# Patient Record
Sex: Female | Born: 1985 | Race: Black or African American | Hispanic: No | Smoking: Never smoker
Health system: Southern US, Community
[De-identification: ages and names within clinical notes are randomized; demographics above are authoritative.]

## PROBLEM LIST (undated history)

## (undated) DIAGNOSIS — D259 Leiomyoma of uterus, unspecified: Secondary | ICD-10-CM

## (undated) DIAGNOSIS — Z789 Other specified health status: Secondary | ICD-10-CM

## (undated) DIAGNOSIS — D649 Anemia, unspecified: Secondary | ICD-10-CM

---

## 2008-09-05 ENCOUNTER — Emergency Department (HOSPITAL_COMMUNITY): Admission: EM | Admit: 2008-09-05 | Discharge: 2008-09-05 | Payer: Self-pay | Admitting: Family Medicine

## 2011-09-24 LAB — OB RESULTS CONSOLE ABO/RH

## 2011-09-24 LAB — OB RESULTS CONSOLE ANTIBODY SCREEN: Antibody Screen: NEGATIVE

## 2011-09-24 LAB — OB RESULTS CONSOLE RPR: RPR: NONREACTIVE

## 2011-09-24 LAB — OB RESULTS CONSOLE GC/CHLAMYDIA
Chlamydia: NEGATIVE
Gonorrhea: NEGATIVE

## 2011-09-24 LAB — OB RESULTS CONSOLE HIV ANTIBODY (ROUTINE TESTING): HIV: NONREACTIVE

## 2012-04-07 ENCOUNTER — Inpatient Hospital Stay (HOSPITAL_COMMUNITY): Payer: 59

## 2012-04-07 ENCOUNTER — Encounter (HOSPITAL_COMMUNITY): Payer: Self-pay

## 2012-04-07 ENCOUNTER — Encounter (HOSPITAL_COMMUNITY): Payer: Self-pay | Admitting: *Deleted

## 2012-04-07 ENCOUNTER — Inpatient Hospital Stay (HOSPITAL_COMMUNITY)
Admission: AD | Admit: 2012-04-07 | Discharge: 2012-04-07 | Disposition: A | Discharge status: Home / Self Care | Payer: 59 | Source: Ambulatory Visit | Attending: Obstetrics and Gynecology | Admitting: Obstetrics and Gynecology

## 2012-04-07 ENCOUNTER — Inpatient Hospital Stay (HOSPITAL_COMMUNITY)
Admission: AD | Admit: 2012-04-07 | Discharge: 2012-04-10 | DRG: 766 | Disposition: A | Discharge status: Home / Self Care | Payer: 59 | Source: Ambulatory Visit | Attending: Obstetrics and Gynecology | Admitting: Obstetrics and Gynecology

## 2012-04-07 DIAGNOSIS — O479 False labor, unspecified: Secondary | ICD-10-CM | POA: Insufficient documentation

## 2012-04-07 DIAGNOSIS — O34219 Maternal care for unspecified type scar from previous cesarean delivery: Principal | ICD-10-CM | POA: Diagnosis not present

## 2012-04-07 HISTORY — DX: Anemia, unspecified: D64.9

## 2012-04-07 HISTORY — DX: Other specified health status: Z78.9

## 2012-04-07 MED ORDER — NIFEDIPINE 10 MG PO CAPS
10.0000 mg | ORAL_CAPSULE | Freq: Once | ORAL | Status: AC
  Administered 2012-04-07: 10 mg via ORAL
  Filled 2012-04-07: qty 1

## 2012-04-07 MED ORDER — ZOLPIDEM TARTRATE 5 MG PO TABS
10.0000 mg | ORAL_TABLET | Freq: Once | ORAL | Status: AC
  Administered 2012-04-07: 10 mg via ORAL
  Filled 2012-04-07: qty 1

## 2012-04-07 NOTE — Progress Notes (Signed)
Dr. Henderson Cloud notified of unchanged cervical exam done by [facility]. Contraction pattern is the same, pt is requesting something for pain. Orders received, ok to send patient home.

## 2012-04-07 NOTE — MAU Note (Signed)
Pt states started having ctx's at 0700 this am. Now q8-10 minutes apart. Denies bleeding or lof.

## 2012-04-08 ENCOUNTER — Encounter (HOSPITAL_COMMUNITY)
Admission: AD | Disposition: A | Discharge status: Home / Self Care | Payer: Self-pay | Source: Ambulatory Visit | Attending: Obstetrics and Gynecology

## 2012-04-08 ENCOUNTER — Encounter (HOSPITAL_COMMUNITY): Payer: Self-pay | Admitting: Family Medicine

## 2012-04-08 ENCOUNTER — Encounter (HOSPITAL_COMMUNITY): Payer: Self-pay | Admitting: Anesthesiology

## 2012-04-08 ENCOUNTER — Inpatient Hospital Stay (HOSPITAL_COMMUNITY): Payer: 59 | Admitting: Anesthesiology

## 2012-04-08 LAB — CBC
HCT: 34.8 % — ABNORMAL LOW (ref 36.0–46.0)
MCH: 21.1 pg — ABNORMAL LOW (ref 26.0–34.0)
MCV: 67.3 fL — ABNORMAL LOW (ref 78.0–100.0)
RDW: 16.1 % — ABNORMAL HIGH (ref 11.5–15.5)
WBC: 7 10*3/uL (ref 4.0–10.5)

## 2012-04-08 LAB — TYPE AND SCREEN: Antibody Screen: NEGATIVE

## 2012-04-08 SURGERY — Surgical Case
Anesthesia: Spinal | Site: Abdomen | Wound class: Clean Contaminated

## 2012-04-08 MED ORDER — OXYTOCIN 40 UNITS IN LACTATED RINGERS INFUSION - SIMPLE MED: 62.5000 mL/h | INTRAVENOUS | Status: AC

## 2012-04-08 MED ORDER — METOCLOPRAMIDE HCL 5 MG/ML IJ SOLN
10.0000 mg | Freq: Three times a day (TID) | INTRAMUSCULAR | Status: DC | PRN
  Administered 2012-04-08: 10 mg via INTRAVENOUS
  Filled 2012-04-08: qty 2

## 2012-04-08 MED ORDER — MEPERIDINE HCL 25 MG/ML IJ SOLN: 6.2500 mg | INTRAMUSCULAR | Status: DC | PRN

## 2012-04-08 MED ORDER — KETOROLAC TROMETHAMINE 30 MG/ML IJ SOLN: 30.0000 mg | Freq: Four times a day (QID) | INTRAMUSCULAR | Status: DC | PRN

## 2012-04-08 MED ORDER — KETOROLAC TROMETHAMINE 30 MG/ML IJ SOLN
30.0000 mg | Freq: Four times a day (QID) | INTRAMUSCULAR | Status: DC | PRN
  Administered 2012-04-08: 30 mg via INTRAVENOUS

## 2012-04-08 MED ORDER — EPHEDRINE 5 MG/ML INJ
INTRAVENOUS | Status: AC
  Filled 2012-04-08: qty 10

## 2012-04-08 MED ORDER — ZOLPIDEM TARTRATE 5 MG PO TABS: 5.0000 mg | ORAL_TABLET | Freq: Every evening | ORAL | Status: DC | PRN

## 2012-04-08 MED ORDER — CEFAZOLIN SODIUM-DEXTROSE 2-3 GM-% IV SOLR
INTRAVENOUS | Status: DC | PRN
  Administered 2012-04-08: 2 g via INTRAVENOUS

## 2012-04-08 MED ORDER — ONDANSETRON HCL 4 MG/2ML IJ SOLN: 4.0000 mg | Freq: Three times a day (TID) | INTRAMUSCULAR | Status: DC | PRN

## 2012-04-08 MED ORDER — FLEET ENEMA 7-19 GM/118ML RE ENEM: 1.0000 | ENEMA | Freq: Every day | RECTAL | Status: DC | PRN

## 2012-04-08 MED ORDER — FENTANYL CITRATE 0.05 MG/ML IJ SOLN
INTRAMUSCULAR | Status: AC
  Filled 2012-04-08: qty 2

## 2012-04-08 MED ORDER — LACTATED RINGERS IV SOLN
INTRAVENOUS | Status: DC
  Administered 2012-04-08 – 2012-04-09 (×3): via INTRAVENOUS

## 2012-04-08 MED ORDER — OXYTOCIN 10 UNIT/ML IJ SOLN
40.0000 [IU] | INTRAVENOUS | Status: DC | PRN
  Administered 2012-04-08: 40 [IU] via INTRAVENOUS

## 2012-04-08 MED ORDER — TETANUS-DIPHTH-ACELL PERTUSSIS 5-2.5-18.5 LF-MCG/0.5 IM SUSP: 0.5000 mL | Freq: Once | INTRAMUSCULAR | Status: DC

## 2012-04-08 MED ORDER — METHYLERGONOVINE MALEATE 0.2 MG PO TABS: 0.2000 mg | ORAL_TABLET | ORAL | Status: DC | PRN

## 2012-04-08 MED ORDER — CITRIC ACID-SODIUM CITRATE 334-500 MG/5ML PO SOLN
ORAL | Status: AC
  Administered 2012-04-08: 30 mL
  Filled 2012-04-08: qty 15

## 2012-04-08 MED ORDER — SIMETHICONE 80 MG PO CHEW
80.0000 mg | CHEWABLE_TABLET | Freq: Three times a day (TID) | ORAL | Status: DC
  Administered 2012-04-08 – 2012-04-10 (×7): 80 mg via ORAL

## 2012-04-08 MED ORDER — FENTANYL CITRATE 0.05 MG/ML IJ SOLN: 25.0000 ug | INTRAMUSCULAR | Status: DC | PRN

## 2012-04-08 MED ORDER — BUTORPHANOL TARTRATE 2 MG/ML IJ SOLN
2.0000 mg | INTRAMUSCULAR | Status: DC | PRN
  Administered 2012-04-08: 2 mg via INTRAVENOUS
  Administered 2012-04-08 (×2): 1 mg via INTRAVENOUS
  Administered 2012-04-08: 2 mg via INTRAVENOUS
  Filled 2012-04-08 (×2): qty 1
  Filled 2012-04-08 (×2): qty 2

## 2012-04-08 MED ORDER — DIPHENHYDRAMINE HCL 25 MG PO CAPS: 25.0000 mg | ORAL_CAPSULE | ORAL | Status: DC | PRN

## 2012-04-08 MED ORDER — DIPHENHYDRAMINE HCL 50 MG/ML IJ SOLN: 25.0000 mg | INTRAMUSCULAR | Status: DC | PRN

## 2012-04-08 MED ORDER — ONDANSETRON HCL 4 MG/2ML IJ SOLN
INTRAMUSCULAR | Status: DC | PRN
  Administered 2012-04-08: 4 mg via INTRAVENOUS

## 2012-04-08 MED ORDER — SCOPOLAMINE 1 MG/3DAYS TD PT72: 1.0000 | MEDICATED_PATCH | Freq: Once | TRANSDERMAL | Status: DC

## 2012-04-08 MED ORDER — ONDANSETRON HCL 4 MG PO TABS: 4.0000 mg | ORAL_TABLET | ORAL | Status: DC | PRN

## 2012-04-08 MED ORDER — OXYTOCIN 10 UNIT/ML IJ SOLN
INTRAMUSCULAR | Status: AC
  Filled 2012-04-08: qty 4

## 2012-04-08 MED ORDER — NALBUPHINE HCL 10 MG/ML IJ SOLN
5.0000 mg | INTRAMUSCULAR | Status: DC | PRN
  Filled 2012-04-08: qty 1

## 2012-04-08 MED ORDER — NALBUPHINE HCL 10 MG/ML IJ SOLN
5.0000 mg | INTRAMUSCULAR | Status: DC | PRN
Start: 2012-04-08 — End: 2012-04-08
  Filled 2012-04-08: qty 1

## 2012-04-08 MED ORDER — EPHEDRINE SULFATE 50 MG/ML IJ SOLN
INTRAMUSCULAR | Status: DC | PRN
  Administered 2012-04-08 (×3): 10 mg via INTRAVENOUS

## 2012-04-08 MED ORDER — SENNOSIDES-DOCUSATE SODIUM 8.6-50 MG PO TABS
2.0000 | ORAL_TABLET | Freq: Every day | ORAL | Status: DC
  Administered 2012-04-08 – 2012-04-09 (×2): 2 via ORAL

## 2012-04-08 MED ORDER — DIPHENHYDRAMINE HCL 50 MG/ML IJ SOLN: 12.5000 mg | INTRAMUSCULAR | Status: DC | PRN

## 2012-04-08 MED ORDER — NALOXONE HCL 0.4 MG/ML IJ SOLN
1.0000 ug/kg/h | INTRAMUSCULAR | Status: DC | PRN
  Filled 2012-04-08: qty 2.5

## 2012-04-08 MED ORDER — WITCH HAZEL-GLYCERIN EX PADS: 1.0000 "application " | MEDICATED_PAD | CUTANEOUS | Status: DC | PRN

## 2012-04-08 MED ORDER — MORPHINE SULFATE (PF) 0.5 MG/ML IJ SOLN
INTRAMUSCULAR | Status: DC | PRN
  Administered 2012-04-08: .1 mg via INTRATHECAL

## 2012-04-08 MED ORDER — ONDANSETRON HCL 4 MG/2ML IJ SOLN
4.0000 mg | INTRAMUSCULAR | Status: DC | PRN
  Administered 2012-04-08: 4 mg via INTRAVENOUS
  Filled 2012-04-08: qty 2

## 2012-04-08 MED ORDER — IBUPROFEN 600 MG PO TABS
600.0000 mg | ORAL_TABLET | Freq: Four times a day (QID) | ORAL | Status: DC
  Administered 2012-04-09 – 2012-04-10 (×7): 600 mg via ORAL
  Filled 2012-04-08 (×7): qty 1

## 2012-04-08 MED ORDER — MIDAZOLAM HCL 2 MG/2ML IJ SOLN: 0.5000 mg | Freq: Once | INTRAMUSCULAR | Status: DC | PRN

## 2012-04-08 MED ORDER — LACTATED RINGERS IV SOLN
INTRAVENOUS | Status: DC
  Administered 2012-04-08: via INTRAVENOUS
  Administered 2012-04-08: 125 mL/h via INTRAVENOUS

## 2012-04-08 MED ORDER — OXYCODONE-ACETAMINOPHEN 5-325 MG PO TABS
1.0000 | ORAL_TABLET | ORAL | Status: DC | PRN
  Administered 2012-04-09 (×3): 1 via ORAL
  Administered 2012-04-10: 2 via ORAL
  Administered 2012-04-10 (×4): 1 via ORAL
  Filled 2012-04-08 (×6): qty 1
  Filled 2012-04-08: qty 2
  Filled 2012-04-08: qty 1

## 2012-04-08 MED ORDER — SODIUM CHLORIDE 0.9 % IJ SOLN: 3.0000 mL | INTRAMUSCULAR | Status: DC | PRN

## 2012-04-08 MED ORDER — LANOLIN HYDROUS EX OINT: 1.0000 "application " | TOPICAL_OINTMENT | CUTANEOUS | Status: DC | PRN

## 2012-04-08 MED ORDER — MEASLES, MUMPS & RUBELLA VAC ~~LOC~~ INJ: 0.5000 mL | INJECTION | Freq: Once | SUBCUTANEOUS | Status: DC

## 2012-04-08 MED ORDER — LACTATED RINGERS IV SOLN
INTRAVENOUS | Status: DC | PRN
  Administered 2012-04-08 (×2): via INTRAVENOUS

## 2012-04-08 MED ORDER — SIMETHICONE 80 MG PO CHEW: 80.0000 mg | CHEWABLE_TABLET | ORAL | Status: DC | PRN

## 2012-04-08 MED ORDER — DIBUCAINE 1 % RE OINT: 1.0000 "application " | TOPICAL_OINTMENT | RECTAL | Status: DC | PRN

## 2012-04-08 MED ORDER — PROMETHAZINE HCL 25 MG/ML IJ SOLN: 6.2500 mg | INTRAMUSCULAR | Status: DC | PRN

## 2012-04-08 MED ORDER — KETOROLAC TROMETHAMINE 30 MG/ML IJ SOLN
INTRAMUSCULAR | Status: AC
  Filled 2012-04-08: qty 1

## 2012-04-08 MED ORDER — DIPHENHYDRAMINE HCL 25 MG PO CAPS
25.0000 mg | ORAL_CAPSULE | Freq: Four times a day (QID) | ORAL | Status: DC | PRN
  Administered 2012-04-08: 25 mg via ORAL
  Filled 2012-04-08: qty 1

## 2012-04-08 MED ORDER — BUPIVACAINE IN DEXTROSE 0.75-8.25 % IT SOLN
INTRATHECAL | Status: DC | PRN
  Administered 2012-04-08: 1.5 mL via INTRATHECAL

## 2012-04-08 MED ORDER — NALOXONE HCL 0.4 MG/ML IJ SOLN: 0.4000 mg | INTRAMUSCULAR | Status: DC | PRN

## 2012-04-08 MED ORDER — ACETAMINOPHEN 10 MG/ML IV SOLN
1000.0000 mg | Freq: Four times a day (QID) | INTRAVENOUS | Status: AC | PRN
  Filled 2012-04-08: qty 100

## 2012-04-08 MED ORDER — METHYLERGONOVINE MALEATE 0.2 MG/ML IJ SOLN: 0.2000 mg | INTRAMUSCULAR | Status: DC | PRN

## 2012-04-08 MED ORDER — MENTHOL 3 MG MT LOZG: 1.0000 | LOZENGE | OROMUCOSAL | Status: DC | PRN

## 2012-04-08 MED ORDER — FERROUS SULFATE 325 (65 FE) MG PO TABS
325.0000 mg | ORAL_TABLET | Freq: Two times a day (BID) | ORAL | Status: DC
  Administered 2012-04-09 – 2012-04-10 (×3): 325 mg via ORAL
  Filled 2012-04-08 (×3): qty 1

## 2012-04-08 MED ORDER — BISACODYL 10 MG RE SUPP: 10.0000 mg | Freq: Every day | RECTAL | Status: DC | PRN

## 2012-04-08 MED ORDER — 0.9 % SODIUM CHLORIDE (POUR BTL) OPTIME
TOPICAL | Status: DC | PRN
  Administered 2012-04-08: 1000 mL

## 2012-04-08 MED ORDER — MORPHINE SULFATE 0.5 MG/ML IJ SOLN
INTRAMUSCULAR | Status: AC
  Filled 2012-04-08: qty 10

## 2012-04-08 MED ORDER — PRENATAL MULTIVITAMIN CH
1.0000 | ORAL_TABLET | Freq: Every day | ORAL | Status: DC
  Administered 2012-04-09 – 2012-04-10 (×2): 1 via ORAL
  Filled 2012-04-08 (×3): qty 1

## 2012-04-08 MED ORDER — ONDANSETRON HCL 4 MG/2ML IJ SOLN
INTRAMUSCULAR | Status: AC
  Filled 2012-04-08: qty 2

## 2012-04-08 MED ORDER — FENTANYL CITRATE 0.05 MG/ML IJ SOLN
INTRAMUSCULAR | Status: DC | PRN
  Administered 2012-04-08: 25 ug via INTRATHECAL

## 2012-04-08 SURGICAL SUPPLY — 39 items
CHLORAPREP W/TINT 26ML (MISCELLANEOUS) ×2 IMPLANT
CLOTH BEACON ORANGE TIMEOUT ST (SAFETY) ×2 IMPLANT
DISSECTOR BLUNT CHERRY 10MM (MISCELLANEOUS) ×2 IMPLANT
DRSG COVADERM 4X10 (GAUZE/BANDAGES/DRESSINGS) ×2 IMPLANT
ELECT REM PT RETURN 9FT ADLT (ELECTROSURGICAL) ×2
ELECTRODE REM PT RTRN 9FT ADLT (ELECTROSURGICAL) ×1 IMPLANT
EXTRACTOR VACUUM BELL STYLE (SUCTIONS) IMPLANT
GLOVE BIO SURGEON STRL SZ7 (GLOVE) ×6 IMPLANT
GLOVE SKINSENSE NS SZ7.5 (GLOVE) ×1
GLOVE SKINSENSE NS SZ8.0 LF (GLOVE) ×1
GLOVE SKINSENSE STRL SZ7.5 (GLOVE) ×1 IMPLANT
GLOVE SKINSENSE STRL SZ8.0 LF (GLOVE) ×1 IMPLANT
GOWN PREVENTION PLUS LG XLONG (DISPOSABLE) ×6 IMPLANT
KIT ABG SYR 3ML LUER SLIP (SYRINGE) IMPLANT
NEEDLE HYPO 25X5/8 SAFETYGLIDE (NEEDLE) IMPLANT
NS IRRIG 1000ML POUR BTL (IV SOLUTION) ×2 IMPLANT
PACK C SECTION WH (CUSTOM PROCEDURE TRAY) ×2 IMPLANT
PAD ABD 7.5X8 STRL (GAUZE/BANDAGES/DRESSINGS) ×2 IMPLANT
PAD OB MATERNITY 4.3X12.25 (PERSONAL CARE ITEMS) ×2 IMPLANT
RETRACTOR WND ALEXIS 25 LRG (MISCELLANEOUS) ×1 IMPLANT
RTRCTR WOUND ALEXIS 25CM LRG (MISCELLANEOUS) ×2
SLEEVE SCD COMPRESS KNEE MED (MISCELLANEOUS) ×2 IMPLANT
SPONGE GAUZE 4X4 12PLY (GAUZE/BANDAGES/DRESSINGS) ×2 IMPLANT
STAPLER VISISTAT 35W (STAPLE) ×2 IMPLANT
SUT MNCRL 0 VIOLET CTX 36 (SUTURE) ×4 IMPLANT
SUT MONOCRYL 0 CTX 36 (SUTURE) ×4
SUT PDS AB 0 CTX 60 (SUTURE) IMPLANT
SUT PLAIN 2 0 (SUTURE) ×1
SUT PLAIN 2 0 XLH (SUTURE) IMPLANT
SUT PLAIN ABS 2-0 CT1 27XMFL (SUTURE) ×1 IMPLANT
SUT VIC AB 0 CT1 27 (SUTURE) ×2
SUT VIC AB 0 CT1 27XBRD ANBCTR (SUTURE) ×2 IMPLANT
SUT VIC AB 2-0 CT1 27 (SUTURE) ×1
SUT VIC AB 2-0 CT1 TAPERPNT 27 (SUTURE) ×1 IMPLANT
SYR 50ML LL SCALE MARK (SYRINGE) ×2 IMPLANT
TAPE CLOTH SURG 4X10 WHT LF (GAUZE/BANDAGES/DRESSINGS) ×2 IMPLANT
TOWEL OR 17X24 6PK STRL BLUE (TOWEL DISPOSABLE) ×4 IMPLANT
TRAY FOLEY CATH 14FR (SET/KITS/TRAYS/PACK) ×2 IMPLANT
WATER STERILE IRR 1000ML POUR (IV SOLUTION) IMPLANT

## 2012-04-08 NOTE — Anesthesia Postprocedure Evaluation (Signed)
Anesthesia Post Note  Patient: Hannah [name]  Procedure(s) Performed: Procedure(s) (LRB): CESAREAN SECTION (N/A)  Anesthesia type: Spinal  Patient location: Mother/Baby  Post pain: Pain level controlled  Post assessment: Post-op Vital signs reviewed  Last Vitals:  Filed Vitals:   04/08/12 1730  BP: 110/75  Pulse: 67  Temp: 36.3 C  Resp: 18    Post vital signs: Reviewed  Level of consciousness: awake  Complications: No apparent anesthesia complications

## 2012-04-08 NOTE — Transfer of Care (Signed)
Immediate Anesthesia Transfer of Care Note  Patient: Hannah [name]  Procedure(s) Performed: Procedure(s) (LRB) with comments: CESAREAN SECTION (N/A) - Repeat Cesarean Section Delivery Boy @ [phone],  Patient Location: PACU  Anesthesia Type: Spinal  Level of Consciousness: awake, alert  and oriented  Airway & Oxygen Therapy: Patient Spontanous Breathing  Post-op Assessment: Report given to PACU RN and Post -op Vital signs reviewed and stable  Post vital signs: Reviewed and stable  Complications: No apparent anesthesia complications

## 2012-04-08 NOTE — Op Note (Signed)
04/07/2012 - 04/08/2012  7:38 AM  PATIENT:  Hannah [name]  26 y.o. female  PRE-OPERATIVE DIAGNOSIS:  Previous Cesarean, Labor  POST-OPERATIVE DIAGNOSIS:  Previous Cesarean, Labor  PROCEDURE:  Procedure(s) (LRB) with comments: CESAREAN SECTION (N/A) - Repeat Cesarean Section Delivery Boy @ [phone],  SURGEON:  Surgeon(s) and Role:    * Loney Laurence, MD - Primary  ASSISTANTS: none   ANESTHESIA:   spinal  EBL:  Total I/O In: -  Out: 800 [Urine:200; Blood:600]   SPECIMEN:  No Specimen  DISPOSITION OF SPECIMEN:  N/A  COUNTS:  YES  DICTATION: .Note written in EPIC  PLAN OF CARE: Admit to inpatient   PATIENT DISPOSITION:  PACU - hemodynamically stable.   Delay start of Pharmacological VTE agent (>24hrs) due to surgical blood loss or risk of bleeding: not applicable  Complications:  none Medications:  Ancef, Pitocin Findings:  Baby female, Apgars 9, weight P.   Normal tubes, ovaries and uterus seen.  Technique:  After adequate spinal anesthesia was achieved, the patient was prepped and draped in usual sterile fashion.  A foley catheter was used to drain the bladder.  A pfannanstiel incision was made with the scalpel and carried down to the fascia with the bovie cautery. The fascia was incised in the midline with the scalpel and carried in a transverse curvilinear manner bilaterally.  The fascia was reflected superiorly and inferiorly off the rectus muscles and the muscles split in the midline superiorly.  The peritoneum and rectus were thickly adhesed to the serosa of the uterus.   A bowel free portion of the peritoneum was entered sharply above the adhesion and careful dissection of the peritoneum, bladder fat and uterus was undertaken with the scalpel by starting at the edges of the adhesion laterally and moving medially.  The peritoneum was able to be removed from the uterus and then the incision extended in a superior and inferior manner with good visualization of the bowel  and bladder.  The Alexis instrument was then placed and the vesico-uterine fascia tented up and incised in a transverse curvilinear manner.  A 2 cm transverse incision was made in the upper portion of the lower uterine segment until the amnion was exposed.   The incision was extended transversely in a blunt manner.  Clear fluid was noted and the baby delivered in the vertex presentation without complication.  The baby was bulb suctioned and the cord was clamped and cut.  The baby was then handed to awaiting Neonatology.  The placenta was then delivered manually and the uterus cleared of all debris.  The uterine incision was then closed with a running lock stitch of 0 monocryl.  An imbricating layer of 0 monocryl was closed as well. good hemostasis of the uterine incision was achieved with multiple additional figure of eight stitches and the abdomen was cleared with irrigation.  At this point the Jon Gills was removed and enfamil and LR were introduced through the foley into the bladder and the bladder was filled.  No spillage of enfamil was seen from any portion of the bladder and this confirmed that the bladder was intact.  The peritoneum was closed with a running stitch of 2-0 vicryl.  This incorporated the rectus muscles as a separate layer.  The fascia was then closed with a running stitch of 0 vicryl.  The subcutaneous layer was closed with interrupted  stitches of 2-0 plain gut.  The skin was closed with staples.  The patient tolerated the procedure  well and was returned to the recovery room in stable condition.  All counts were correct times three.  Delaine Canter A

## 2012-04-08 NOTE — Addendum Note (Signed)
Addendum  created 04/08/12 1812 by Algis Greenhouse, CRNA   Modules edited:Notes Section

## 2012-04-08 NOTE — Anesthesia Procedure Notes (Signed)
Spinal  Patient location during procedure: OR Start time: 04/08/2012 6:38 AM Staffing Anesthesiologist: Brayton Caves R Performed by: anesthesiologist  Preanesthetic Checklist Completed: patient identified, site marked, surgical consent, pre-op evaluation, timeout performed, IV checked, risks and benefits discussed and monitors and equipment checked Spinal Block Patient position: sitting Prep: DuraPrep Patient monitoring: heart rate, cardiac monitor, continuous pulse ox and blood pressure Approach: midline Location: L3-4 Injection technique: single-shot Needle Needle type: Sprotte  Needle gauge: 24 G Needle length: 9 cm Assessment Sensory level: T4 Additional Notes Patient identified.  Risk benefits discussed including failed block, incomplete pain control, headache, nerve damage, paralysis, blood pressure changes, nausea, vomiting, reactions to medication both toxic or allergic, and postpartum back pain.  Patient expressed understanding and wished to proceed.  All questions were answered.  Sterile technique used throughout procedure.  CSF was clear.  No parasthesia or other complications.  Please see nursing notes for vital signs.

## 2012-04-08 NOTE — H&P (Signed)
26 y.o.  [redacted]w[redacted]d    G3P0011 comes in c/o ctxes.  She was observed o/n for labor and has changed her cervix and continued with regular painful ctxes.  She was scheduled for a repeat cesarean section at term.  Patient has good fetal movement and no bleeding. Her last meal was at 20:30 and she has been NPO o/n.  Past Medical History  Diagnosis Date  . No pertinent past medical history   . Anemia     Past Surgical History  Procedure Date  . Cesarean section     OB History    Grav Para Term Preterm Abortions TAB SAB Ect Mult Living   3 1   1 1    1      # Outc Date GA Lbr Len/2nd Wgt Sex Del Anes PTL Lv   1 TAB 2006           2 PAR 2008           3 CUR               History   Social History  . Marital Status: Single    Spouse Name: N/A    Number of Children: N/A  . Years of Education: N/A   Occupational History  . Not on file.   Social History Main Topics  . Smoking status: Never Smoker   . Smokeless tobacco: Not on file  . Alcohol Use: No  . Drug Use: No  . Sexually Active: Yes   Other Topics Concern  . Not on file   Social History Narrative  . No narrative on file   Review of patient's allergies indicates no known allergies.   Prenatal Course: Uncomplicated.  Filed Vitals:   04/08/12 0509  BP: 135/81  Pulse: 72  Temp: 97.7 F (36.5 C)  Resp: 18     Lungs/Cor:  NAD Abdomen:  soft, gravid Ex:  no cords, erythema SVE:  NA FHTs:  120s, gstv, NST r. Pt had BPP 8/10 earlier last afternoon when she first presented with ctxes.  A/P  [redacted]w[redacted]d in labor.  For repeat cesarean sectionat term.  All risks, benefits and alternatives discussed with patient and she desires to proceed.  Does not desire BTL.  Chioma Mukherjee A

## 2012-04-08 NOTE — Progress Notes (Signed)
  Prenatal Transfer Tool  Maternal Diabetes: No Genetic Screening: Declined Maternal Ultrasounds/Referrals: Normal Fetal Ultrasounds or other Referrals:  None Maternal Substance Abuse:  No Significant Maternal Medications:  None Significant Maternal Lab Results: None    

## 2012-04-08 NOTE — Anesthesia Preprocedure Evaluation (Signed)
Anesthesia Evaluation  Patient identified by MRN, date of birth, ID band Patient awake    Reviewed: Allergy & Precautions, H&P , NPO status , Patient's Chart, lab work & pertinent test results  Airway Mallampati: II      Dental No notable dental hx.    Pulmonary neg pulmonary ROS,  breath sounds clear to auscultation  Pulmonary exam normal       Cardiovascular Exercise Tolerance: Good negative cardio ROS  Rhythm:regular Rate:Normal     Neuro/Psych negative neurological ROS  negative psych ROS   GI/Hepatic negative GI ROS, Neg liver ROS,   Endo/Other  negative endocrine ROS  Renal/GU negative Renal ROS  negative genitourinary   Musculoskeletal   Abdominal Normal abdominal exam  (+)   Peds  Hematology negative hematology ROS (+)   Anesthesia Other Findings   Reproductive/Obstetrics (+) Pregnancy                           Anesthesia Physical Anesthesia Plan  ASA: II and Emergent  Anesthesia Plan: Spinal   Post-op Pain Management:    Induction:   Airway Management Planned:   Additional Equipment:   Intra-op Plan:   Post-operative Plan:   Informed Consent: I have reviewed the patients History and Physical, chart, labs and discussed the procedure including the risks, benefits and alternatives for the proposed anesthesia with the patient or authorized representative who has indicated his/her understanding and acceptance.     Plan Discussed with: Anesthesiologist, CRNA and Surgeon  Anesthesia Plan Comments:         Anesthesia Quick Evaluation  

## 2012-04-08 NOTE — Brief Op Note (Signed)
04/07/2012 - 04/08/2012  7:38 AM  PATIENT:  Hannah [name]  26 y.o. female  PRE-OPERATIVE DIAGNOSIS:  Previous Cesarean, Labor  POST-OPERATIVE DIAGNOSIS:  Previous Cesarean, Labor  PROCEDURE:  Procedure(s) (LRB) with comments: CESAREAN SECTION (N/A) - Repeat Cesarean Section Delivery Boy @ 0659,  SURGEON:  Surgeon(s) and Role:    * Kambri Dismore A Camerin Ladouceur, MD - Primary  ASSISTANTS: none   ANESTHESIA:   spinal  EBL:  Total I/O In: -  Out: 800 [Urine:200; Blood:600]   SPECIMEN:  No Specimen  DISPOSITION OF SPECIMEN:  N/A  COUNTS:  YES  DICTATION: .Note written in EPIC  PLAN OF CARE: Admit to inpatient   PATIENT DISPOSITION:  PACU - hemodynamically stable.   Delay start of Pharmacological VTE agent (>24hrs) due to surgical blood loss or risk of bleeding: not applicable  Complications:  none Medications:  Ancef, Pitocin Findings:  Baby female, Apgars 9, weight P.   Normal tubes, ovaries and uterus seen.  Technique:  After adequate spinal anesthesia was achieved, the patient was prepped and draped in usual sterile fashion.  A foley catheter was used to drain the bladder.  A pfannanstiel incision was made with the scalpel and carried down to the fascia with the bovie cautery. The fascia was incised in the midline with the scalpel and carried in a transverse curvilinear manner bilaterally.  The fascia was reflected superiorly and inferiorly off the rectus muscles and the muscles split in the midline superiorly.  The peritoneum and rectus were thickly adhesed to the serosa of the uterus.   A bowel free portion of the peritoneum was entered sharply above the adhesion and careful dissection of the peritoneum, bladder fat and uterus was undertaken with the scalpel by starting at the edges of the adhesion laterally and moving medially.  The peritoneum was able to be removed from the uterus and then the incision extended in a superior and inferior manner with good visualization of the bowel  and bladder.  The Alexis instrument was then placed and the vesico-uterine fascia tented up and incised in a transverse curvilinear manner.  A 2 cm transverse incision was made in the upper portion of the lower uterine segment until the amnion was exposed.   The incision was extended transversely in a blunt manner.  Clear fluid was noted and the baby delivered in the vertex presentation without complication.  The baby was bulb suctioned and the cord was clamped and cut.  The baby was then handed to awaiting Neonatology.  The placenta was then delivered manually and the uterus cleared of all debris.  The uterine incision was then closed with a running lock stitch of 0 monocryl.  An imbricating layer of 0 monocryl was closed as well. good hemostasis of the uterine incision was achieved with multiple additional figure of eight stitches and the abdomen was cleared with irrigation.  At this point the Alexis was removed and enfamil and LR were introduced through the foley into the bladder and the bladder was filled.  No spillage of enfamil was seen from any portion of the bladder and this confirmed that the bladder was intact.  The peritoneum was closed with a running stitch of 2-0 vicryl.  This incorporated the rectus muscles as a separate layer.  The fascia was then closed with a running stitch of 0 vicryl.  The subcutaneous layer was closed with interrupted  stitches of 2-0 plain gut.  The skin was closed with staples.  The patient tolerated the procedure   well and was returned to the recovery room in stable condition.  All counts were correct times three.  Hannah [name] A    

## 2012-04-08 NOTE — Consult Note (Signed)
Neonatology Note:   Attendance at C-section:    I was asked to attend this repeat C/S at term, in labor. The mother is a G3P1A1 O pos, GBS neg with an uncomplicated pregnancy. ROM at delivery, fluid clear. Infant vigorous with good spontaneous cry and tone. Needed only minimal bulb suctioning. Ap 9/9. Lungs clear to ausc in DR. To CN to care of Pediatrician.   Deatra James, MD

## 2012-04-08 NOTE — Progress Notes (Signed)
Dr. Henderson Cloud at the bedside to discuss risks and benefits of cesarean section delivery. Pt states understanding. Informed consents signed.

## 2012-04-08 NOTE — Anesthesia Postprocedure Evaluation (Signed)
  Anesthesia Post-op Note  Patient: Hannah [name]  Procedure(s) Performed: Procedure(s) (LRB) with comments: CESAREAN SECTION (N/A) - Repeat Cesarean Section Delivery Boy @ [phone],  Patient Location: PACU  Anesthesia Type: Spinal  Level of Consciousness: awake, alert  and oriented  Airway and Oxygen Therapy: Patient Spontanous Breathing  Post-op Pain: none  Post-op Assessment: Post-op Vital signs reviewed, Patient's Cardiovascular Status Stable, Respiratory Function Stable, Patent Airway, No signs of Nausea or vomiting, Pain level controlled, No headache and No backache  Post-op Vital Signs: Reviewed and stable  Complications: No apparent anesthesia complications

## 2012-04-09 LAB — CBC
HCT: 30.3 % — ABNORMAL LOW (ref 36.0–46.0)
MCH: 21.2 pg — ABNORMAL LOW (ref 26.0–34.0)
MCV: 67.6 fL — ABNORMAL LOW (ref 78.0–100.0)
Platelets: 231 10*3/uL (ref 150–400)
RBC: 4.48 MIL/uL (ref 3.87–5.11)
RDW: 16.1 % — ABNORMAL HIGH (ref 11.5–15.5)

## 2012-04-09 NOTE — Progress Notes (Signed)
Subjective: Postpartum Day 1: Cesarean Delivery Patient reports incisional pain and tolerating PO.  Having problems getting baby to breastfeed  Objective: Vital signs in last 24 hours: Temp:  [97.3 F (36.3 C)-98.5 F (36.9 C)] 98.3 F (36.8 C) (09/21 0800) Pulse Rate:  [67-105] 88  (09/21 0800) Resp:  [16-20] 18  (09/21 0800) BP: (107-137)/(60-79) 116/70 mmHg (09/21 0800) SpO2:  [95 %-100 %] 98 % (09/21 0800)  Physical Exam:  General: alert, cooperative and appears stated age Lochia: appropriate Uterine Fundus: firm Incision: dressing intact DVT Evaluation: No evidence of DVT seen on physical exam.   Basename 04/09/12 0517 04/08/12 0035  HGB 9.5* 10.9*  HCT 30.3* 34.8*    Assessment/Plan: Status post Cesarean section. Doing well postoperatively.  Continue current care. Work with lactation  Desires circ, will postpone to tomorrow  Hannah [name] H. 04/09/2012, 11:01 AM

## 2012-04-10 ENCOUNTER — Encounter (HOSPITAL_COMMUNITY): Payer: Self-pay | Admitting: Obstetrics and Gynecology

## 2012-04-10 MED ORDER — OXYCODONE-ACETAMINOPHEN 5-325 MG PO TABS: 2.0000 | ORAL_TABLET | ORAL | Status: DC | PRN

## 2012-04-10 MED ORDER — IBUPROFEN 600 MG PO TABS: 600.0000 mg | ORAL_TABLET | Freq: Four times a day (QID) | ORAL | Status: DC | PRN

## 2012-04-10 MED ORDER — DOCUSATE SODIUM 100 MG PO CAPS: 100.0000 mg | ORAL_CAPSULE | Freq: Two times a day (BID) | ORAL | Status: DC

## 2012-04-10 NOTE — Discharge Summary (Signed)
Obstetric Discharge Summary Reason for Admission: onset of labor Prenatal Procedures: ultrasound Intrapartum Procedures: cesarean: low cervical, transverse Postpartum Procedures: none Complications-Operative and Postpartum: none Hemoglobin  Date Value Range Status  04/09/2012 9.5* 12.0 - 15.0 g/dL Final     HCT  Date Value Range Status  04/09/2012 30.3* 36.0 - 46.0 % Final    Physical Exam:  General: alert, cooperative and appears stated age 26: appropriate Uterine Fundus: firm Incision: healing well DVT Evaluation: No evidence of DVT seen on physical exam.  Discharge Diagnoses: Term Pregnancy-delivered  Discharge Information: Date: 04/10/2012 Activity: pelvic rest Diet: routine Medications: Ibuprofen, Colace and Percocet Condition: improved Instructions: refer to practice specific booklet Discharge to: home Follow-up Information    Follow up with HORVATH,MICHELLE A, MD. In 4 weeks. (For a postpartum check)    Contact information:   [address] [place] [address] [phone]       Follow up with HORVATH,MICHELLE A, MD. On 04/12/2012. (For staple removal)    Contact information:   [address] [place] [address] [phone]          Newborn Data: Live born female  Birth Weight: 6 lb 13 oz (3090 g) APGAR: 9, 9  Home with mother.  Franziska Podgurski H. 04/10/2012, 9:21 AM

## 2012-04-12 ENCOUNTER — Other Ambulatory Visit (HOSPITAL_COMMUNITY): Payer: Self-pay

## 2012-04-15 ENCOUNTER — Inpatient Hospital Stay (HOSPITAL_COMMUNITY): Admission: AD | Admit: 2012-04-15 | Payer: 59 | Source: Ambulatory Visit | Admitting: Obstetrics and Gynecology

## 2012-04-15 SURGERY — Surgical Case
Anesthesia: Regional

## 2014-05-21 ENCOUNTER — Encounter (HOSPITAL_COMMUNITY): Payer: Self-pay | Admitting: Obstetrics and Gynecology

## 2019-12-15 ENCOUNTER — Ambulatory Visit (HOSPITAL_COMMUNITY)
Admission: RE | Admit: 2019-12-15 | Discharge: 2019-12-15 | Disposition: A | Discharge status: Home / Self Care | Payer: Self-pay | Source: Ambulatory Visit | Attending: Internal Medicine | Admitting: Internal Medicine

## 2019-12-15 ENCOUNTER — Other Ambulatory Visit: Payer: Self-pay

## 2019-12-15 DIAGNOSIS — D649 Anemia, unspecified: Secondary | ICD-10-CM | POA: Insufficient documentation

## 2019-12-15 LAB — PREPARE RBC (CROSSMATCH)

## 2019-12-15 LAB — ABO/RH: ABO/RH(D): O POS

## 2019-12-15 MED ORDER — SODIUM CHLORIDE 0.9% IV SOLUTION: Freq: Once | INTRAVENOUS | Status: AC

## 2019-12-15 NOTE — Discharge Instructions (Signed)

## 2019-12-15 NOTE — Progress Notes (Signed)
Patient was transfused with 2 units of packed red blood cells as ordered by Lyda Kalata MD via a PIV. Pre transfusion type and screen lab was obtained. Tolerated well, vitals stable, discharge instructions given, verbalized understanding. Patient alert, oriented and ambulatory at the time of discharge.

## 2019-12-17 LAB — BPAM RBC
Blood Product Expiration Date: 202106302359
Blood Product Expiration Date: 202106302359
ISSUE DATE / TIME: 202105281049
ISSUE DATE / TIME: 202105281049
Unit Type and Rh: 5100
Unit Type and Rh: 5100

## 2019-12-17 LAB — TYPE AND SCREEN
ABO/RH(D): O POS
Antibody Screen: NEGATIVE
Unit division: 0
Unit division: 0

## 2021-01-13 ENCOUNTER — Other Ambulatory Visit: Payer: Self-pay | Admitting: Obstetrics & Gynecology

## 2021-01-13 DIAGNOSIS — D259 Leiomyoma of uterus, unspecified: Secondary | ICD-10-CM

## 2021-01-28 ENCOUNTER — Other Ambulatory Visit: Payer: Self-pay

## 2021-02-13 ENCOUNTER — Other Ambulatory Visit: Payer: Self-pay

## 2021-03-09 ENCOUNTER — Other Ambulatory Visit: Payer: BC Managed Care – PPO

## 2021-05-03 ENCOUNTER — Other Ambulatory Visit: Payer: Self-pay

## 2021-05-03 ENCOUNTER — Ambulatory Visit
Admission: RE | Admit: 2021-05-03 | Discharge: 2021-05-03 | Disposition: A | Discharge status: Home / Self Care | Payer: BC Managed Care – PPO | Source: Ambulatory Visit | Attending: Obstetrics & Gynecology | Admitting: Obstetrics & Gynecology

## 2021-05-03 DIAGNOSIS — D259 Leiomyoma of uterus, unspecified: Secondary | ICD-10-CM

## 2021-05-03 MED ORDER — GADOBENATE DIMEGLUMINE 529 MG/ML IV SOLN
15.0000 mL | Freq: Once | INTRAVENOUS | Status: AC | PRN
  Administered 2021-05-03: 15 mL via INTRAVENOUS

## 2021-07-29 ENCOUNTER — Other Ambulatory Visit: Payer: Self-pay

## 2021-07-29 ENCOUNTER — Encounter (HOSPITAL_BASED_OUTPATIENT_CLINIC_OR_DEPARTMENT_OTHER): Payer: Self-pay | Admitting: Obstetrics and Gynecology

## 2021-07-29 NOTE — Progress Notes (Signed)
Spoke w/ via phone for pre-op interview--- Hannah [name] Lab needs dos----  UPT, T&S and CBC             Lab results------ COVID test -----Thursday 07/31/21. [place]. Arrive at -------7121 NPO after MN NO Solid Food.  Clear liquids from MN until--- water until 0915 Med rec completed Medications to take morning of surgery -----NONE Diabetic medication ----- Patient instructed no nail polish to be worn day of surgery Patient instructed to bring photo id and insurance card day of surgery Patient aware to have Driver (ride ) / caregiver  Hannah [name] (Mother)   for 24 hours after surgery  Patient Special Instructions ----- Pre-Op special Istructions ----- Patient verbalized understanding of instructions that were given at this phone interview. Patient denies shortness of breath, chest pain, fever, cough at this phone interview.

## 2021-07-31 ENCOUNTER — Other Ambulatory Visit: Payer: Self-pay | Admitting: Obstetrics and Gynecology

## 2021-07-31 LAB — SARS CORONAVIRUS 2 (TAT 6-24 HRS): SARS Coronavirus 2: NEGATIVE

## 2021-08-03 NOTE — H&P (Signed)
Hannah [name] is an 36 y.o. female P2 with menorrhagia and uterine fibroid.  Still having very heavy periods with cramps, last 5-7 days. Cramps are not as bad since starting lysteda. Has to stay home from work on heaviest period days due to passing clots. On heaviest day changing pad/tampon every 30-45 minutes.  Has received blood transfusion in the past.  Previously tried [place] and OCPs which did not help.   12/29/19 [place]: Uterus 10x9x7cm, fibroid 8.54x4.76x6.94, fills anterior uterus. Bilateral ovaries/adnexa wnl 05/03/21 MRI: Measures 12.7 x 8.5 x 7.8 cm. There is a large intramural uterine fibroid centered at the mid anterior uterine myometrium which extends to effaces the endometrium and measures 7.9 x 6.8 x 7.5 cm.   Patient's last menstrual period was 07/19/2021 (approximate).    Past Medical History:  Diagnosis Date   Anemia    No pertinent past medical history     Past Surgical History:  Procedure Laterality Date   CESAREAN SECTION     CESAREAN SECTION  04/08/2012   Procedure: CESAREAN SECTION;  Surgeon: Daria Pastures, MD;  Location: [facility];  Service: Obstetrics;  Laterality: N/A;  Repeat Cesarean Section Delivery Boy @ [phone],    Family History  Problem Relation Age of Onset   Hypertension Mother    Other Neg Hx     Social History:  reports that she has never smoked. She does not have any smokeless tobacco history on file. She reports that she does not drink alcohol and does not use drugs.  Allergies: No Known Allergies  Medications Prior to Admission  Medication Sig Dispense Refill Last Dose   ferrous sulfate 325 (65 FE) MG tablet Take 325 mg by mouth daily with breakfast.   08/03/2021    Review of Systems  Constitutional:  Negative for fever.  HENT:  Negative for sore throat.   Eyes:  Negative for pain.  Respiratory:  Negative for shortness of breath.   Cardiovascular:  Negative for chest pain.  Gastrointestinal:  Negative for abdominal pain.   Genitourinary:  Positive for menstrual problem.  Musculoskeletal:  Negative for joint swelling.  Skin:  Negative for rash.  Neurological:  Negative for headaches.  Psychiatric/Behavioral:  Negative for suicidal ideas.    Blood pressure 134/74, pulse 90, temperature 98.6 F (37 C), temperature source Oral, resp. rate 16, height 5\' 1"  (1.549 m), weight 83.9 kg, last menstrual period 07/19/2021, SpO2 100 %, not currently breastfeeding. Physical Exam   Chaperone Chaperone: present  Constitutional *General Appearance: healthy-appearing  Head Head: normocephalic  Cardiovascular *Auscultation: RRR, no murmur  Lungs *Respiratory Effort: no accessory muscle usage *Auscultation: clear to auscultation  *Breast Bilateral: no skin changes, nipple appearance: normal, no abnormal nipple secretions, no tenderness, no masses palpable Right Breast: normal Left Breast: normal  Abdomen *Inspection/Palpation/Auscultation: non-distended, soft  Female Genitalia Vulva: no masses, no atrophy, no lesions Mons: normal Labia Majora: normal Labia Minora: normal Introitus: normal *Vagina: normal, no discharge *Cervix: grossly normal, no lesions, no discharge *Uterus: normal contour, mobile, non-tender, enlarged 15 weeks size *Adnexa/Parametria: no mass palpable, no tenderness Results for orders placed or performed during the hospital encounter of 08/04/21 (from the past 24 hour(s))  Pregnancy, urine POC     Status: None   Collection Time: 08/04/21 10:43 AM  Result Value Ref Range   Preg Test, Ur NEGATIVE NEGATIVE  Type and screen [facility]     Status: None   Collection Time: 08/04/21 10:49 AM  Result Value Ref Range  ABO/RH(D) O POS    Antibody Screen NEG    Sample Expiration      08/07/2021,2359 Performed at [facility], [place] [address] [place], [place] [address]   CBC WITH DIFFERENTIAL     Status: Abnormal   Collection Time: 08/04/21 10:49 AM   Result Value Ref Range   WBC 5.7 4.0 - 10.5 K/uL   RBC 5.06 3.87 - 5.11 MIL/uL   Hemoglobin 9.8 (L) 12.0 - 15.0 g/dL   HCT 34.1 (L) 36.0 - 46.0 %   MCV 67.4 (L) 80.0 - 100.0 fL   MCH 19.4 (L) 26.0 - 34.0 pg   MCHC 28.7 (L) 30.0 - 36.0 g/dL   RDW 18.0 (H) 11.5 - 15.5 %   Platelets 382 150 - 400 K/uL   nRBC 0.0 0.0 - 0.2 %   Neutrophils Relative % 79 %   Neutro Abs 4.5 1.7 - 7.7 K/uL   Lymphocytes Relative 12 %   Lymphs Abs 0.7 0.7 - 4.0 K/uL   Monocytes Relative 8 %   Monocytes Absolute 0.4 0.1 - 1.0 K/uL   Eosinophils Relative 1 %   Eosinophils Absolute 0.0 0.0 - 0.5 K/uL   Basophils Relative 0 %   Basophils Absolute 0.0 0.0 - 0.1 K/uL   Immature Granulocytes 0 %   Abs Immature Granulocytes 0.01 0.00 - 0.07 K/uL    No results found.  Assessment/Plan: 35Y P2 with abnormal uterine bleeding - leiomyoma - Plan: Exam under anesthesia, Sonata radiofrequency ablation with possible Myosure hysteroscopic myomectomy - Informed consent obtained. Reviewed risks include  infection, bleeding, uterine perforation, damage to surrounding organs, failure to achieve desired results.   Rowland Lathe 08/04/2021, 12:31 PM

## 2021-08-04 ENCOUNTER — Encounter (HOSPITAL_BASED_OUTPATIENT_CLINIC_OR_DEPARTMENT_OTHER)
Admission: RE | Disposition: A | Discharge status: Home / Self Care | Payer: Self-pay | Source: Home / Self Care | Attending: Obstetrics and Gynecology

## 2021-08-04 ENCOUNTER — Ambulatory Visit (HOSPITAL_BASED_OUTPATIENT_CLINIC_OR_DEPARTMENT_OTHER)
Admission: RE | Admit: 2021-08-04 | Discharge: 2021-08-04 | Disposition: A | Discharge status: Home / Self Care | Payer: BC Managed Care – PPO | Attending: Obstetrics and Gynecology | Admitting: Obstetrics and Gynecology

## 2021-08-04 ENCOUNTER — Ambulatory Visit (HOSPITAL_BASED_OUTPATIENT_CLINIC_OR_DEPARTMENT_OTHER): Payer: BC Managed Care – PPO | Admitting: Anesthesiology

## 2021-08-04 ENCOUNTER — Other Ambulatory Visit: Payer: Self-pay

## 2021-08-04 ENCOUNTER — Encounter (HOSPITAL_BASED_OUTPATIENT_CLINIC_OR_DEPARTMENT_OTHER): Payer: Self-pay | Admitting: Obstetrics and Gynecology

## 2021-08-04 DIAGNOSIS — D649 Anemia, unspecified: Secondary | ICD-10-CM | POA: Insufficient documentation

## 2021-08-04 DIAGNOSIS — D759 Disease of blood and blood-forming organs, unspecified: Secondary | ICD-10-CM | POA: Diagnosis not present

## 2021-08-04 DIAGNOSIS — Z01818 Encounter for other preprocedural examination: Secondary | ICD-10-CM

## 2021-08-04 DIAGNOSIS — D259 Leiomyoma of uterus, unspecified: Secondary | ICD-10-CM | POA: Insufficient documentation

## 2021-08-04 HISTORY — PX: DILATATION & CURETTAGE/HYSTEROSCOPY WITH MYOSURE: SHX6511

## 2021-08-04 LAB — CBC WITH DIFFERENTIAL/PLATELET
Abs Immature Granulocytes: 0.01 10*3/uL (ref 0.00–0.07)
Basophils Absolute: 0 10*3/uL (ref 0.0–0.1)
Basophils Relative: 0 %
Eosinophils Absolute: 0 10*3/uL (ref 0.0–0.5)
Eosinophils Relative: 1 %
HCT: 34.1 % — ABNORMAL LOW (ref 36.0–46.0)
Hemoglobin: 9.8 g/dL — ABNORMAL LOW (ref 12.0–15.0)
Immature Granulocytes: 0 %
Lymphocytes Relative: 12 %
Lymphs Abs: 0.7 10*3/uL (ref 0.7–4.0)
MCH: 19.4 pg — ABNORMAL LOW (ref 26.0–34.0)
MCHC: 28.7 g/dL — ABNORMAL LOW (ref 30.0–36.0)
MCV: 67.4 fL — ABNORMAL LOW (ref 80.0–100.0)
Monocytes Absolute: 0.4 10*3/uL (ref 0.1–1.0)
Monocytes Relative: 8 %
Neutro Abs: 4.5 10*3/uL (ref 1.7–7.7)
Neutrophils Relative %: 79 %
Platelets: 382 10*3/uL (ref 150–400)
RBC: 5.06 MIL/uL (ref 3.87–5.11)
RDW: 18 % — ABNORMAL HIGH (ref 11.5–15.5)
WBC: 5.7 10*3/uL (ref 4.0–10.5)
nRBC: 0 % (ref 0.0–0.2)

## 2021-08-04 LAB — POCT PREGNANCY, URINE: Preg Test, Ur: NEGATIVE

## 2021-08-04 LAB — TYPE AND SCREEN
ABO/RH(D): O POS
Antibody Screen: NEGATIVE

## 2021-08-04 SURGERY — RADIOFREQUENCY ABLATION, LEIOMYOMA, UTERUS, TRANSCERVICAL APPROACH, WITH US GUIDANCE
Anesthesia: General | Site: Uterus

## 2021-08-04 MED ORDER — FENTANYL CITRATE (PF) 100 MCG/2ML IJ SOLN: 25.0000 ug | INTRAMUSCULAR | Status: DC | PRN

## 2021-08-04 MED ORDER — MIDAZOLAM HCL 2 MG/2ML IJ SOLN
INTRAMUSCULAR | Status: AC
  Filled 2021-08-04: qty 2

## 2021-08-04 MED ORDER — EPHEDRINE SULFATE 50 MG/ML IJ SOLN
INTRAMUSCULAR | Status: DC | PRN
  Administered 2021-08-04: 10 mg via INTRAVENOUS
  Administered 2021-08-04: 5 mg via INTRAVENOUS
  Administered 2021-08-04: 10 mg via INTRAVENOUS

## 2021-08-04 MED ORDER — MIDAZOLAM HCL 5 MG/5ML IJ SOLN
INTRAMUSCULAR | Status: DC | PRN
  Administered 2021-08-04: 2 mg via INTRAVENOUS

## 2021-08-04 MED ORDER — IBUPROFEN 200 MG PO TABS: 600.0000 mg | ORAL_TABLET | Freq: Four times a day (QID) | ORAL | Status: DC | PRN

## 2021-08-04 MED ORDER — KETOROLAC TROMETHAMINE 30 MG/ML IJ SOLN
INTRAMUSCULAR | Status: DC | PRN
Start: 2021-08-04 — End: 2021-08-04
  Administered 2021-08-04: 30 mg via INTRAVENOUS

## 2021-08-04 MED ORDER — PROPOFOL 10 MG/ML IV BOLUS
INTRAVENOUS | Status: AC
  Filled 2021-08-04: qty 20

## 2021-08-04 MED ORDER — LIDOCAINE HCL (PF) 2 % IJ SOLN
INTRAMUSCULAR | Status: AC
  Filled 2021-08-04: qty 5

## 2021-08-04 MED ORDER — LACTATED RINGERS IV SOLN: INTRAVENOUS | Status: DC

## 2021-08-04 MED ORDER — DEXAMETHASONE SODIUM PHOSPHATE 4 MG/ML IJ SOLN
INTRAMUSCULAR | Status: DC | PRN
  Administered 2021-08-04: 10 mg via INTRAVENOUS

## 2021-08-04 MED ORDER — ACETAMINOPHEN 500 MG PO TABS
ORAL_TABLET | ORAL | Status: AC
  Filled 2021-08-04: qty 2

## 2021-08-04 MED ORDER — EPHEDRINE 5 MG/ML INJ
INTRAVENOUS | Status: AC
  Filled 2021-08-04: qty 5

## 2021-08-04 MED ORDER — ONDANSETRON HCL 4 MG/2ML IJ SOLN
INTRAMUSCULAR | Status: AC
  Filled 2021-08-04: qty 2

## 2021-08-04 MED ORDER — SODIUM CHLORIDE 0.9 % IR SOLN
Status: DC | PRN
  Administered 2021-08-04 (×2): 3000 mL

## 2021-08-04 MED ORDER — ACETAMINOPHEN 500 MG PO TABS: 1000.0000 mg | ORAL_TABLET | Freq: Once | ORAL | Status: DC

## 2021-08-04 MED ORDER — LIDOCAINE HCL (CARDIAC) PF 100 MG/5ML IV SOSY
PREFILLED_SYRINGE | INTRAVENOUS | Status: DC | PRN
Start: 2021-08-04 — End: 2021-08-04
  Administered 2021-08-04: 60 mg via INTRAVENOUS

## 2021-08-04 MED ORDER — FENTANYL CITRATE (PF) 100 MCG/2ML IJ SOLN
INTRAMUSCULAR | Status: AC
  Filled 2021-08-04: qty 2

## 2021-08-04 MED ORDER — ACETAMINOPHEN 160 MG/5ML PO SOLN: 1000.0000 mg | Freq: Once | ORAL | Status: DC | PRN

## 2021-08-04 MED ORDER — OXYCODONE HCL 5 MG/5ML PO SOLN: 5.0000 mg | Freq: Once | ORAL | Status: AC | PRN

## 2021-08-04 MED ORDER — PROPOFOL 10 MG/ML IV BOLUS
INTRAVENOUS | Status: DC | PRN
Start: 2021-08-04 — End: 2021-08-04
  Administered 2021-08-04: 160 mg via INTRAVENOUS

## 2021-08-04 MED ORDER — OXYCODONE HCL 5 MG PO TABS
5.0000 mg | ORAL_TABLET | Freq: Once | ORAL | Status: AC | PRN
  Administered 2021-08-04: 5 mg via ORAL

## 2021-08-04 MED ORDER — ACETAMINOPHEN 500 MG PO TABS
1000.0000 mg | ORAL_TABLET | ORAL | Status: AC
  Administered 2021-08-04: 1000 mg via ORAL

## 2021-08-04 MED ORDER — OXYCODONE HCL 5 MG PO TABS
ORAL_TABLET | ORAL | Status: AC
  Filled 2021-08-04: qty 1

## 2021-08-04 MED ORDER — SOD CITRATE-CITRIC ACID 500-334 MG/5ML PO SOLN: 30.0000 mL | ORAL | Status: DC

## 2021-08-04 MED ORDER — KETOROLAC TROMETHAMINE 30 MG/ML IJ SOLN
INTRAMUSCULAR | Status: AC
  Filled 2021-08-04: qty 1

## 2021-08-04 MED ORDER — DEXAMETHASONE SODIUM PHOSPHATE 10 MG/ML IJ SOLN
INTRAMUSCULAR | Status: AC
  Filled 2021-08-04: qty 1

## 2021-08-04 MED ORDER — ACETAMINOPHEN 10 MG/ML IV SOLN: 1000.0000 mg | Freq: Once | INTRAVENOUS | Status: DC | PRN

## 2021-08-04 MED ORDER — ONDANSETRON HCL 4 MG/2ML IJ SOLN
INTRAMUSCULAR | Status: DC | PRN
  Administered 2021-08-04: 4 mg via INTRAVENOUS

## 2021-08-04 MED ORDER — LIDOCAINE HCL 1 % IJ SOLN
INTRAMUSCULAR | Status: DC | PRN
  Administered 2021-08-04: 20 mL

## 2021-08-04 MED ORDER — KETOROLAC TROMETHAMINE 15 MG/ML IJ SOLN: 15.0000 mg | INTRAMUSCULAR | Status: DC

## 2021-08-04 MED ORDER — ACETAMINOPHEN 500 MG PO TABS: 1000.0000 mg | ORAL_TABLET | Freq: Once | ORAL | Status: DC | PRN

## 2021-08-04 MED ORDER — FENTANYL CITRATE (PF) 100 MCG/2ML IJ SOLN
INTRAMUSCULAR | Status: DC | PRN
  Administered 2021-08-04 (×6): 50 ug via INTRAVENOUS

## 2021-08-04 SURGICAL SUPPLY — 26 items
CATH ROBINSON RED A/P 16FR (CATHETERS) ×2 IMPLANT
DRSG TELFA 3X8 NADH (GAUZE/BANDAGES/DRESSINGS) ×2 IMPLANT
ELECT DISPERSIVE SONATA (ELECTRODE) ×4 IMPLANT
GAUZE 4X4 16PLY ~~LOC~~+RFID DBL (SPONGE) ×2 IMPLANT
GLOVE SURG ENC MOIS LTX SZ6 (GLOVE) ×2 IMPLANT
GLOVE SURG NEOPR MICRO LF SZ6 (GLOVE) ×1 IMPLANT
GLOVE SURG UNDER POLY LF SZ6 (GLOVE) ×1 IMPLANT
GLOVE SURG UNDER POLY LF SZ6.5 (GLOVE) ×2 IMPLANT
GOWN STRL REUS W/ TWL LRG LVL3 (GOWN DISPOSABLE) ×1 IMPLANT
GOWN STRL REUS W/TWL LRG LVL3 (GOWN DISPOSABLE) ×4 IMPLANT
HANDPIECE RFA SONATA (MISCELLANEOUS) ×2 IMPLANT
IV NS IRRIG 3000ML ARTHROMATIC (IV SOLUTION) ×2 IMPLANT
KIT PROCEDURE FLUENT (KITS) ×2 IMPLANT
KIT TURNOVER CYSTO (KITS) ×2 IMPLANT
MYOSURE XL FIBROID (MISCELLANEOUS) ×2
PACK VAGINAL MINOR WOMEN LF (CUSTOM PROCEDURE TRAY) ×2 IMPLANT
PAD DRESSING TELFA 3X8 NADH (GAUZE/BANDAGES/DRESSINGS) ×1 IMPLANT
PAD OB MATERNITY 4.3X12.25 (PERSONAL CARE ITEMS) ×2 IMPLANT
PAD PREP 24X48 CUFFED NSTRL (MISCELLANEOUS) ×2 IMPLANT
SEAL CERVICAL OMNI LOK (ABLATOR) ×1 IMPLANT
SEAL ROD LENS SCOPE MYOSURE (ABLATOR) ×2 IMPLANT
SYR 50ML LL SCALE MARK (SYRINGE) ×2 IMPLANT
SYSTEM TISS REMOVAL MYOSURE XL (MISCELLANEOUS) IMPLANT
TOWEL OR 17X26 10 PK STRL BLUE (TOWEL DISPOSABLE) ×2 IMPLANT
UNDERPAD 30X36 HEAVY ABSORB (UNDERPADS AND DIAPERS) ×2 IMPLANT
WATER STERILE IRR 500ML POUR (IV SOLUTION) ×2 IMPLANT

## 2021-08-04 NOTE — Anesthesia Preprocedure Evaluation (Addendum)
Anesthesia Evaluation  Patient identified by MRN, date of birth, ID band Patient awake    Reviewed: Allergy & Precautions, NPO status , Patient's Chart, lab work & pertinent test results  History of Anesthesia Complications Negative for: history of anesthetic complications  Airway Mallampati: II  TM Distance: >3 FB Neck ROM: Full    Dental  (+) Dental Advisory Given   Pulmonary neg pulmonary ROS,    breath sounds clear to auscultation       Cardiovascular negative cardio ROS   Rhythm:Regular     Neuro/Psych negative neurological ROS  negative psych ROS   GI/Hepatic negative GI ROS, Neg liver ROS,   Endo/Other  negative endocrine ROS  Renal/GU negative Renal ROS     Musculoskeletal   Abdominal   Peds  Hematology  (+) Blood dyscrasia, anemia , Lab Results      Component                Value               Date                      WBC                      5.7                 08/04/2021                HGB                      9.8 (L)             08/04/2021                HCT                      34.1 (L)            08/04/2021                MCV                      67.4 (L)            08/04/2021                PLT                      382                 08/04/2021              Anesthesia Other Findings uterine leiomyoma  Reproductive/Obstetrics Lab Results      Component                Value               Date                      PREGTESTUR               NEGATIVE            08/04/2021                                       Anesthesia Physical  Anesthesia Plan  ASA: 1  Anesthesia Plan: General   Post-op Pain Management: Tylenol PO (pre-op) and Toradol IV (intra-op)   Induction: Intravenous  PONV Risk Score and Plan: 3 and Ondansetron, Dexamethasone, Propofol infusion and TIVA  Airway Management Planned: LMA  Additional Equipment: None  Intra-op Plan:   Post-operative Plan:  Extubation in OR  Informed Consent: I have reviewed the patients History and Physical, chart, labs and discussed the procedure including the risks, benefits and alternatives for the proposed anesthesia with the patient or authorized representative who has indicated his/her understanding and acceptance.     Dental advisory given  Plan Discussed with: CRNA and Anesthesiologist  Anesthesia Plan Comments:         Anesthesia Quick Evaluation

## 2021-08-04 NOTE — Op Note (Addendum)
08/04/2021   2:46 PM   PATIENT:  Hannah [name]  36 y.o. female   PRE-OPERATIVE DIAGNOSIS:  uterine leiomyoma   POST-OPERATIVE DIAGNOSIS:  uterine leiomyoma   PROCEDURE:  Exam under anesthesia, Sonata transcervical radiofrequency ablation, hysteroscopy myomectomy with Myosure   SURGEON:  Surgeon(s) and Role:    * Rowland Lathe, MD - Primary   ANESTHESIA:   local and general   EBL:  10 mL    BLOOD ADMINISTERED:none   DRAINS: none    LOCAL MEDICATIONS USED:  LIDOCAINE  and Amount: 10 ml   SPECIMEN:  Source of Specimen:  uterine fibroid   DISPOSITION OF SPECIMEN:  PATHOLOGY   COUNTS:  YES   PLAN OF CARE: Discharge to home after PACU   PATIENT DISPOSITION:  PACU - hemodynamically stable.    FINDINGS: Enlarged uterus sounded to 13cm with palpable anterior 8cm fibroid. On hysterscopic view, right ostia visualized. Left ostia obscured by large anterior uterine fibroid which was protruding into the cavity. Otherwise thin appearing endometrium. On ultrasound view, 8cm anterior uterine fibroid.   Total fluid deficit 677mL  PROCEDURE IN DETAIL:  The patient was appropriately consented and taken to the operating room where anesthesia was administered without difficulty. Thromboguards were placed and connected. She was placed in the dorsal lithotomy position in stirrups. She was examined under anesthesia and noted to have an enlarged 13 week size uterus with a palpable anterior fibroid. The patient was then prepped and draped in normal sterile fashion. A long speculum was inserted into the vagina. A single-tooth tenaculum was used to grasp the anterior lip of the cervix. A paracervical block was obtained by injecting a total of 52mL of 1% lidocaine at the 4 and 8 o'clock positions at the cervicovaginal junction. The uterus was sounded to 13cm. The cervical os was sequentially dilated using Pratt dilators to accommodate the [place] device.  The Sonata treatment device with  integrated ultrasound and radiofrequency energy delivery system was inserted into the uterine cavity. Global assessment was performed identifying the single anterior, no additional fibroids were visualized. The anterior 8cmm type 2-5 fibroid was treated in 3 sections.  12o'clock. Mid: 2.7x2cm (2:30) 10o'clock, mid: 4.8x4.1cm (5:46) 1o'clock mid: 3.9x3.2cm (5:00)  Each ablation was performed in the following manner:  A graphical overlay was placed to target the ablation zone. The trocar tip was introduced. Care was taken to ensure that the guide was within the serosal boundary and sparing the endometrium. The needle electrodes were deployed. A safety check was again performed. Radiofrequency ablation was then performed in each section for the above noted time. The fibroid appeared ablated with US guidance with outgassing noted. [employer] ultrasonographer present throughout [place] portion of procedure.The Sonata handpiece was removed.   The hysteroscope was replaced. Blood clots were noted in the cavity, then cleared by hysteroscope The Myosure XL device was used to resect the portion of the fibroid that was extending into the cavity. Specimen was collected for pathology. Hemostasis was noted in the uterine cavity. The hysteroscope was removed. The tenaculum was removed from the cervix and good hemostasis was noted at the puncture sites. The patient tolerated the procedure well. The instrument and sponge counts were correct times two. The patient was awakened from anesthesia and taken to the recovery room in stable condition.  Irene Pap, MD 08/04/21 3:06 PM

## 2021-08-04 NOTE — Brief Op Note (Signed)
08/04/2021  2:46 PM  PATIENT:  Hannah [name]  36 y.o. female  PRE-OPERATIVE DIAGNOSIS:  uterine leiomyoma  POST-OPERATIVE DIAGNOSIS:  uterine leiomyoma  PROCEDURE:  Exam under anesthesia, Sonata transcervical radiofrequency ablation, hysteroscopy myomectomy with Myosure  SURGEON:  Surgeon(s) and Role:    * Rowland Lathe, MD - Primary  ANESTHESIA:   local and general  EBL:  10 mL   BLOOD ADMINISTERED:none  DRAINS: none   LOCAL MEDICATIONS USED:  LIDOCAINE  and Amount: 10 ml  SPECIMEN:  Source of Specimen:  uterine fibroid  DISPOSITION OF SPECIMEN:  PATHOLOGY  COUNTS:  YES  TOURNIQUET:  * No tourniquets in log *  DICTATION: .Note written in EPIC  PLAN OF CARE: Discharge to home after PACU  PATIENT DISPOSITION:  PACU - hemodynamically stable.   Delay start of Pharmacological VTE agent (>24hrs) due to surgical blood loss or risk of bleeding: not applicable

## 2021-08-04 NOTE — Anesthesia Procedure Notes (Signed)
Procedure Name: LMA Insertion Date/Time: 08/04/2021 1:21 PM Performed by: Justice Rocher, CRNA Pre-anesthesia Checklist: Patient identified, Emergency Drugs available, Suction available, Patient being monitored and Timeout performed Patient Re-evaluated:Patient Re-evaluated prior to induction Oxygen Delivery Method: Circle system utilized Preoxygenation: Pre-oxygenation with 100% oxygen Induction Type: IV induction Ventilation: Mask ventilation without difficulty LMA: LMA inserted LMA Size: 3.0 Number of attempts: 1 Airway Equipment and Method: Bite block Placement Confirmation: positive ETCO2, breath sounds checked- equal and bilateral and CO2 detector Tube secured with: Tape Dental Injury: Teeth and Oropharynx as per pre-operative assessment

## 2021-08-04 NOTE — Transfer of Care (Signed)
Immediate Anesthesia Transfer of Care Note  Patient: Hannah [name]  Procedure(s) Performed: Procedure(s) (LRB): Radio Frequency Ablation with Sonata (N/A) HYSTEROSCOPIC MYOSURE MYOMECTOMY (N/A)  Patient Location: PACU  Anesthesia Type: General  Level of Consciousness: awake, sedated, patient cooperative and responds to stimulation  Airway & Oxygen Therapy: Patient Spontanous Breathing and Patient connected to [place] 02 and soft FM   Post-op Assessment: Report given to PACU RN, Post -op Vital signs reviewed and stable and Patient moving all extremities  Post vital signs: Reviewed and stable  Complications: No apparent anesthesia complications

## 2021-08-04 NOTE — Discharge Instructions (Addendum)
DISCHARGE INSTRUCTIONS: HYSTEROSCOPY / ENDOMETRIAL ABLATION The following instructions have been prepared to help you care for yourself upon your return home.  May Remove Scop patch on or before  May take Ibuprofen after  May take stool softner while taking narcotic pain medication to prevent constipation.  Drink plenty of water.  Personal hygiene:  Use sanitary pads for vaginal drainage, not tampons.  Shower the day after your procedure.  NO tub baths, pools or Jacuzzis for 2-3 weeks.  Wipe front to back after using the bathroom.  Activity and limitations:  Do NOT drive or operate any equipment for 24 hours. The effects of anesthesia are still present and drowsiness may result.  Do NOT rest in bed all day.  Walking is encouraged.  Walk up and down stairs slowly.  You may resume your normal activity in one to two days or as indicated by your physician. Sexual activity: NO intercourse for at least 2 weeks after the procedure, or as indicated by your Doctor.  Diet: Eat a light meal as desired this evening. You may resume your usual diet tomorrow.  Return to Work: You may resume your work activities in one to two days or as indicated by Marine scientist.  What to expect after your surgery: Expect to have vaginal bleeding/discharge for 2-3 days and spotting for up to 10 days. It is not unusual to have soreness for up to 1-2 weeks. You may have a slight burning sensation when you urinate for the first day. Mild cramps may continue for a couple of days. You may have a regular period in 2-6 weeks.  Call your doctor for any of the following:  Excessive vaginal bleeding or clotting, saturating and changing one pad every hour.  Inability to urinate 6 hours after discharge from hospital.  Pain not relieved by pain medication.  Fever of 100.4 F or greater.  Unusual vaginal discharge or odor.  Post Anesthesia Home Care Instructions  Activity: Get plenty of rest for the remainder of the  day. A responsible individual must stay with you for 24 hours following the procedure.  For the next 24 hours, DO NOT: -Drive a car -Paediatric nurse -Drink alcoholic beverages -Take any medication unless instructed by your physician -Make any legal decisions or sign important papers.  Meals: Start with liquid foods such as gelatin or soup. Progress to regular foods as tolerated. Avoid greasy, spicy, heavy foods. If nausea and/or vomiting occur, drink only clear liquids until the nausea and/or vomiting subsides. Call your physician if vomiting continues.  Special Instructions/Symptoms: Your throat may feel dry or sore from the anesthesia or the breathing tube placed in your throat during surgery. If this causes discomfort, gargle with warm salt water. The discomfort should disappear within 24 hours.  No acetaminophen/Tylenol until after 5:15 pm today if needed. No ibuprofen, Advil, Aleve, Motrin, ketorolac, meloxicam, naproxen, or other NSAIDS until after 8:30 pm today if needed.

## 2021-08-05 LAB — SURGICAL PATHOLOGY

## 2021-08-05 NOTE — Anesthesia Postprocedure Evaluation (Signed)
Anesthesia Post Note  Patient: Hannah [name]  Procedure(s) Performed: Radio Frequency Ablation with Sonata (Uterus) HYSTEROSCOPIC MYOSURE MYOMECTOMY (Uterus)     Patient location during evaluation: PACU Anesthesia Type: General Level of consciousness: awake and alert Pain management: pain level controlled Vital Signs Assessment: post-procedure vital signs reviewed and stable Respiratory status: spontaneous breathing, nonlabored ventilation, respiratory function stable and patient connected to nasal cannula oxygen Cardiovascular status: blood pressure returned to baseline and stable Postop Assessment: no apparent nausea or vomiting Anesthetic complications: no   No notable events documented.  Last Vitals:  Vitals:   08/04/21 1515 08/04/21 1600  BP: (!) 141/82 138/73  Pulse: 80 88  Resp: 17 16  Temp:  36.6 C  SpO2: 99% 99%    Last Pain:  Vitals:   08/04/21 1600  TempSrc:   PainSc: 0-No pain                 [name]

## 2021-08-06 ENCOUNTER — Encounter (HOSPITAL_BASED_OUTPATIENT_CLINIC_OR_DEPARTMENT_OTHER): Payer: Self-pay | Admitting: Obstetrics and Gynecology

## 2021-09-04 ENCOUNTER — Other Ambulatory Visit: Payer: Self-pay

## 2021-09-04 ENCOUNTER — Encounter (HOSPITAL_COMMUNITY): Payer: Self-pay | Admitting: Emergency Medicine

## 2021-09-04 ENCOUNTER — Emergency Department (HOSPITAL_COMMUNITY): Payer: BC Managed Care – PPO

## 2021-09-04 ENCOUNTER — Inpatient Hospital Stay (HOSPITAL_COMMUNITY)
Admission: EM | Admit: 2021-09-04 | Discharge: 2021-09-12 | DRG: 698 | Disposition: A | Discharge status: Home / Self Care | Payer: BC Managed Care – PPO | Attending: Obstetrics and Gynecology | Admitting: Obstetrics and Gynecology

## 2021-09-04 DIAGNOSIS — N739 Female pelvic inflammatory disease, unspecified: Secondary | ICD-10-CM | POA: Diagnosis present

## 2021-09-04 DIAGNOSIS — Z20822 Contact with and (suspected) exposure to covid-19: Secondary | ICD-10-CM | POA: Diagnosis present

## 2021-09-04 DIAGNOSIS — R6520 Severe sepsis without septic shock: Secondary | ICD-10-CM | POA: Diagnosis present

## 2021-09-04 DIAGNOSIS — A498 Other bacterial infections of unspecified site: Secondary | ICD-10-CM

## 2021-09-04 DIAGNOSIS — A419 Sepsis, unspecified organism: Secondary | ICD-10-CM

## 2021-09-04 DIAGNOSIS — Y838 Other surgical procedures as the cause of abnormal reaction of the patient, or of later complication, without mention of misadventure at the time of the procedure: Secondary | ICD-10-CM | POA: Diagnosis present

## 2021-09-04 DIAGNOSIS — E871 Hypo-osmolality and hyponatremia: Secondary | ICD-10-CM | POA: Diagnosis present

## 2021-09-04 DIAGNOSIS — R7881 Bacteremia: Secondary | ICD-10-CM

## 2021-09-04 DIAGNOSIS — N179 Acute kidney failure, unspecified: Secondary | ICD-10-CM | POA: Diagnosis present

## 2021-09-04 DIAGNOSIS — D500 Iron deficiency anemia secondary to blood loss (chronic): Secondary | ICD-10-CM | POA: Diagnosis present

## 2021-09-04 DIAGNOSIS — Z888 Allergy status to other drugs, medicaments and biological substances status: Secondary | ICD-10-CM

## 2021-09-04 DIAGNOSIS — N920 Excessive and frequent menstruation with regular cycle: Secondary | ICD-10-CM | POA: Diagnosis not present

## 2021-09-04 DIAGNOSIS — E861 Hypovolemia: Secondary | ICD-10-CM | POA: Diagnosis present

## 2021-09-04 DIAGNOSIS — E872 Acidosis, unspecified: Secondary | ICD-10-CM | POA: Diagnosis present

## 2021-09-04 DIAGNOSIS — Z79899 Other long term (current) drug therapy: Secondary | ICD-10-CM

## 2021-09-04 DIAGNOSIS — E876 Hypokalemia: Secondary | ICD-10-CM | POA: Diagnosis not present

## 2021-09-04 DIAGNOSIS — N9989 Other postprocedural complications and disorders of genitourinary system: Secondary | ICD-10-CM | POA: Diagnosis not present

## 2021-09-04 DIAGNOSIS — N730 Acute parametritis and pelvic cellulitis: Secondary | ICD-10-CM | POA: Diagnosis present

## 2021-09-04 DIAGNOSIS — D696 Thrombocytopenia, unspecified: Secondary | ICD-10-CM | POA: Diagnosis present

## 2021-09-04 HISTORY — DX: Female pelvic inflammatory disease, unspecified: N73.9

## 2021-09-04 HISTORY — DX: Sepsis, unspecified organism: A41.9

## 2021-09-04 LAB — CBC WITH DIFFERENTIAL/PLATELET
Abs Immature Granulocytes: 0.6 10*3/uL — ABNORMAL HIGH (ref 0.00–0.07)
Basophils Absolute: 0.1 10*3/uL (ref 0.0–0.1)
Basophils Relative: 0 %
Eosinophils Absolute: 0 10*3/uL (ref 0.0–0.5)
Eosinophils Relative: 0 %
HCT: 27.2 % — ABNORMAL LOW (ref 36.0–46.0)
Hemoglobin: 8.1 g/dL — ABNORMAL LOW (ref 12.0–15.0)
Immature Granulocytes: 4 %
Lymphocytes Relative: 1 %
Lymphs Abs: 0.2 10*3/uL — ABNORMAL LOW (ref 0.7–4.0)
MCH: 18.6 pg — ABNORMAL LOW (ref 26.0–34.0)
MCHC: 29.8 g/dL — ABNORMAL LOW (ref 30.0–36.0)
MCV: 62.4 fL — ABNORMAL LOW (ref 80.0–100.0)
Monocytes Absolute: 0.4 10*3/uL (ref 0.1–1.0)
Monocytes Relative: 2 %
Neutro Abs: 14.8 10*3/uL — ABNORMAL HIGH (ref 1.7–7.7)
Neutrophils Relative %: 93 %
Platelets: 311 10*3/uL (ref 150–400)
RBC: 4.36 MIL/uL (ref 3.87–5.11)
RDW: 17.7 % — ABNORMAL HIGH (ref 11.5–15.5)
WBC: 16.1 10*3/uL — ABNORMAL HIGH (ref 4.0–10.5)
nRBC: 0 % (ref 0.0–0.2)

## 2021-09-04 LAB — LACTIC ACID, PLASMA: Lactic Acid, Venous: 3.3 mmol/L (ref 0.5–1.9)

## 2021-09-04 LAB — COMPREHENSIVE METABOLIC PANEL
ALT: 15 U/L (ref 0–44)
AST: 23 U/L (ref 15–41)
Albumin: 3 g/dL — ABNORMAL LOW (ref 3.5–5.0)
Alkaline Phosphatase: 130 U/L — ABNORMAL HIGH (ref 38–126)
Anion gap: 15 (ref 5–15)
BUN: 26 mg/dL — ABNORMAL HIGH (ref 6–20)
CO2: 17 mmol/L — ABNORMAL LOW (ref 22–32)
Calcium: 8.2 mg/dL — ABNORMAL LOW (ref 8.9–10.3)
Chloride: 101 mmol/L (ref 98–111)
Creatinine, Ser: 3.33 mg/dL — ABNORMAL HIGH (ref 0.44–1.00)
GFR, Estimated: 18 mL/min — ABNORMAL LOW (ref >60)
Glucose, Bld: 140 mg/dL — ABNORMAL HIGH (ref 70–99)
Potassium: 3.4 mmol/L — ABNORMAL LOW (ref 3.5–5.1)
Sodium: 133 mmol/L — ABNORMAL LOW (ref 135–145)
Total Bilirubin: 1.9 mg/dL — ABNORMAL HIGH (ref 0.3–1.2)
Total Protein: 8 g/dL (ref 6.5–8.1)

## 2021-09-04 LAB — PROTIME-INR
INR: 1.4 — ABNORMAL HIGH (ref 0.8–1.2)
Prothrombin Time: 16.9 seconds — ABNORMAL HIGH (ref 11.4–15.2)

## 2021-09-04 LAB — APTT: aPTT: 46 seconds — ABNORMAL HIGH (ref 24–36)

## 2021-09-04 MED ORDER — LACTATED RINGERS IV BOLUS (SEPSIS)
1000.0000 mL | Freq: Once | INTRAVENOUS | Status: AC
  Administered 2021-09-05: 1000 mL via INTRAVENOUS

## 2021-09-04 NOTE — ED Triage Notes (Signed)
Patient complaining of fever, nausea, vomiting, uncontrolled diarrhea, chills that started on Monday (09/01/2021).

## 2021-09-04 NOTE — ED Provider Triage Note (Signed)
Emergency Medicine Provider Triage Evaluation Note  Hannah [name] , a 36 y.o. female  was evaluated in triage.  Pt complains of  nausea, vomiting, she had surgery at the end of 08/04/21 since then has been having discharge. She states that over the past week her pain has increased.  She has had increased discharge. She began getting febrile and nausea today.  Physical Exam  BP (!) 91/51 (BP Location: Left Arm)    Pulse (!) 133    Temp (!) 97.5 F (36.4 C) (Oral)    Resp 18    Ht 5\' 2"  (1.575 m)    Wt 81.6 kg    SpO2 100%    BMI 32.92 kg/m  Gen:   Awake, diaphoretic, appears unwell Resp:  Normal effort  MSK:   Moves extremities without difficulty  Other:  Patient's lower abdomen is tender.  Medical Decision Making  Medically screening exam initiated at 10:56 PM.  Appropriate orders placed.  TALAYSIA PINHEIRO was informed that the remainder of the evaluation will be completed by another provider, this initial triage assessment does not replace that evaluation, and the importance of remaining in the ED until their evaluation is complete.  Patient has been febrile at home, here she is tachycardic with softer blood pressures.  Given that she has been having this ongoing pain and discharge that is now worsening since her ablation I am concerned she may have a bacterial infection. Labs are ordered.  Given her borderline hypotension fluids are ordered also. Triage RN is aware that patient is possibly septic and needs to be roomed.  Note: Portions of this report may have been transcribed using voice recognition software. Every effort was made to ensure accuracy; however, inadvertent computerized transcription errors may be present    Lorin Glass, PA-C 09/04/21 2311

## 2021-09-05 ENCOUNTER — Emergency Department (HOSPITAL_COMMUNITY): Payer: BC Managed Care – PPO

## 2021-09-05 DIAGNOSIS — R7881 Bacteremia: Secondary | ICD-10-CM | POA: Diagnosis not present

## 2021-09-05 DIAGNOSIS — N179 Acute kidney failure, unspecified: Secondary | ICD-10-CM

## 2021-09-05 DIAGNOSIS — N730 Acute parametritis and pelvic cellulitis: Secondary | ICD-10-CM | POA: Diagnosis not present

## 2021-09-05 DIAGNOSIS — R6521 Severe sepsis with septic shock: Secondary | ICD-10-CM

## 2021-09-05 DIAGNOSIS — N9989 Other postprocedural complications and disorders of genitourinary system: Secondary | ICD-10-CM | POA: Diagnosis present

## 2021-09-05 DIAGNOSIS — E871 Hypo-osmolality and hyponatremia: Secondary | ICD-10-CM | POA: Diagnosis present

## 2021-09-05 DIAGNOSIS — E876 Hypokalemia: Secondary | ICD-10-CM

## 2021-09-05 DIAGNOSIS — A419 Sepsis, unspecified organism: Secondary | ICD-10-CM | POA: Diagnosis present

## 2021-09-05 DIAGNOSIS — Z20822 Contact with and (suspected) exposure to covid-19: Secondary | ICD-10-CM | POA: Diagnosis present

## 2021-09-05 DIAGNOSIS — D696 Thrombocytopenia, unspecified: Secondary | ICD-10-CM | POA: Diagnosis present

## 2021-09-05 DIAGNOSIS — A498 Other bacterial infections of unspecified site: Secondary | ICD-10-CM | POA: Diagnosis not present

## 2021-09-05 DIAGNOSIS — Y838 Other surgical procedures as the cause of abnormal reaction of the patient, or of later complication, without mention of misadventure at the time of the procedure: Secondary | ICD-10-CM | POA: Diagnosis present

## 2021-09-05 DIAGNOSIS — E872 Acidosis, unspecified: Secondary | ICD-10-CM | POA: Diagnosis present

## 2021-09-05 DIAGNOSIS — E861 Hypovolemia: Secondary | ICD-10-CM | POA: Diagnosis present

## 2021-09-05 DIAGNOSIS — N739 Female pelvic inflammatory disease, unspecified: Secondary | ICD-10-CM | POA: Diagnosis present

## 2021-09-05 DIAGNOSIS — D500 Iron deficiency anemia secondary to blood loss (chronic): Secondary | ICD-10-CM | POA: Diagnosis present

## 2021-09-05 DIAGNOSIS — R6520 Severe sepsis without septic shock: Secondary | ICD-10-CM | POA: Diagnosis present

## 2021-09-05 DIAGNOSIS — N920 Excessive and frequent menstruation with regular cycle: Secondary | ICD-10-CM | POA: Diagnosis not present

## 2021-09-05 DIAGNOSIS — Z888 Allergy status to other drugs, medicaments and biological substances status: Secondary | ICD-10-CM | POA: Diagnosis not present

## 2021-09-05 DIAGNOSIS — Z79899 Other long term (current) drug therapy: Secondary | ICD-10-CM | POA: Diagnosis not present

## 2021-09-05 LAB — CBC WITH DIFFERENTIAL/PLATELET
Abs Immature Granulocytes: 0.15 10*3/uL — ABNORMAL HIGH (ref 0.00–0.07)
Basophils Absolute: 0.1 10*3/uL (ref 0.0–0.1)
Basophils Relative: 0 %
Eosinophils Absolute: 0 10*3/uL (ref 0.0–0.5)
Eosinophils Relative: 0 %
HCT: 22.5 % — ABNORMAL LOW (ref 36.0–46.0)
Hemoglobin: 6.6 g/dL — CL (ref 12.0–15.0)
Immature Granulocytes: 1 %
Lymphocytes Relative: 3 %
Lymphs Abs: 0.4 10*3/uL — ABNORMAL LOW (ref 0.7–4.0)
MCH: 18 pg — ABNORMAL LOW (ref 26.0–34.0)
MCHC: 29.3 g/dL — ABNORMAL LOW (ref 30.0–36.0)
MCV: 61.5 fL — ABNORMAL LOW (ref 80.0–100.0)
Monocytes Absolute: 1 10*3/uL (ref 0.1–1.0)
Monocytes Relative: 6 %
Neutro Abs: 15.3 10*3/uL — ABNORMAL HIGH (ref 1.7–7.7)
Neutrophils Relative %: 90 %
Platelets: 231 10*3/uL (ref 150–400)
RBC: 3.66 MIL/uL — ABNORMAL LOW (ref 3.87–5.11)
RDW: 17.3 % — ABNORMAL HIGH (ref 11.5–15.5)
WBC: 16.9 10*3/uL — ABNORMAL HIGH (ref 4.0–10.5)
nRBC: 0 % (ref 0.0–0.2)

## 2021-09-05 LAB — TYPE AND SCREEN
ABO/RH(D): O POS
Antibody Screen: NEGATIVE
Unit division: 0
Unit division: 0

## 2021-09-05 LAB — COMPREHENSIVE METABOLIC PANEL
ALT: 14 U/L (ref 0–44)
AST: 18 U/L (ref 15–41)
Albumin: 2.3 g/dL — ABNORMAL LOW (ref 3.5–5.0)
Alkaline Phosphatase: 85 U/L (ref 38–126)
Anion gap: 10 (ref 5–15)
BUN: 32 mg/dL — ABNORMAL HIGH (ref 6–20)
CO2: 18 mmol/L — ABNORMAL LOW (ref 22–32)
Calcium: 7.6 mg/dL — ABNORMAL LOW (ref 8.9–10.3)
Chloride: 104 mmol/L (ref 98–111)
Creatinine, Ser: 2.47 mg/dL — ABNORMAL HIGH (ref 0.44–1.00)
GFR, Estimated: 26 mL/min — ABNORMAL LOW (ref >60)
Glucose, Bld: 98 mg/dL (ref 70–99)
Potassium: 3.6 mmol/L (ref 3.5–5.1)
Sodium: 132 mmol/L — ABNORMAL LOW (ref 135–145)
Total Bilirubin: 1 mg/dL (ref 0.3–1.2)
Total Protein: 6.4 g/dL — ABNORMAL LOW (ref 6.5–8.1)

## 2021-09-05 LAB — BPAM RBC
Blood Product Expiration Date: 202303182359
Blood Product Expiration Date: 202303182359
Unit Type and Rh: 5100
Unit Type and Rh: 5100

## 2021-09-05 LAB — GC/CHLAMYDIA PROBE AMP (~~LOC~~) NOT AT ARMC
Chlamydia: NEGATIVE
Comment: NEGATIVE
Comment: NORMAL
Neisseria Gonorrhea: NEGATIVE

## 2021-09-05 LAB — GLUCOSE, CAPILLARY
Glucose-Capillary: 122 mg/dL — ABNORMAL HIGH (ref 70–99)
Glucose-Capillary: 136 mg/dL — ABNORMAL HIGH (ref 70–99)

## 2021-09-05 LAB — WET PREP, GENITAL
Sperm: NONE SEEN
Trich, Wet Prep: NONE SEEN
WBC, Wet Prep HPF POC: >=10 — AB (ref <10)
Yeast Wet Prep HPF POC: NONE SEEN

## 2021-09-05 LAB — RESP PANEL BY RT-PCR (FLU A&B, COVID) ARPGX2
Influenza A by PCR: NEGATIVE
Influenza B by PCR: NEGATIVE
SARS Coronavirus 2 by RT PCR: NEGATIVE

## 2021-09-05 LAB — URINALYSIS, ROUTINE W REFLEX MICROSCOPIC
Bilirubin Urine: NEGATIVE
Glucose, UA: 50 mg/dL — AB
Ketones, ur: NEGATIVE mg/dL
Leukocytes,Ua: NEGATIVE
Nitrite: NEGATIVE
Protein, ur: >=300 mg/dL — AB
Specific Gravity, Urine: 1.014 (ref 1.005–1.030)
pH: 5 (ref 5.0–8.0)

## 2021-09-05 LAB — BILIRUBIN, FRACTIONATED(TOT/DIR/INDIR)
Bilirubin, Direct: 0.4 mg/dL — ABNORMAL HIGH (ref 0.0–0.2)
Indirect Bilirubin: 0.6 mg/dL (ref 0.3–0.9)
Total Bilirubin: 1 mg/dL (ref 0.3–1.2)

## 2021-09-05 LAB — PREPARE RBC (CROSSMATCH)

## 2021-09-05 LAB — LACTIC ACID, PLASMA
Lactic Acid, Venous: 2.6 mmol/L (ref 0.5–1.9)
Lactic Acid, Venous: 1.1 mmol/L (ref 0.5–1.9)
Lactic Acid, Venous: 0.9 mmol/L (ref 0.5–1.9)

## 2021-09-05 LAB — MAGNESIUM: Magnesium: 1.4 mg/dL — ABNORMAL LOW (ref 1.7–2.4)

## 2021-09-05 LAB — HEMOGLOBIN AND HEMATOCRIT, BLOOD
HCT: 25.2 % — ABNORMAL LOW (ref 36.0–46.0)
Hemoglobin: 7.9 g/dL — ABNORMAL LOW (ref 12.0–15.0)

## 2021-09-05 LAB — MRSA NEXT GEN BY PCR, NASAL: MRSA by PCR Next Gen: NOT DETECTED

## 2021-09-05 LAB — HCG, QUANTITATIVE, PREGNANCY: hCG, Beta Chain, Quant, S: <1 m[IU]/mL (ref <5)

## 2021-09-05 MED ORDER — SODIUM CHLORIDE 0.9 % IV SOLN
1.0000 g | INTRAVENOUS | Status: DC
  Administered 2021-09-05: 1 g via INTRAVENOUS
  Filled 2021-09-05: qty 10

## 2021-09-05 MED ORDER — SODIUM CHLORIDE 0.9 % IV SOLN
100.0000 mg | Freq: Two times a day (BID) | INTRAVENOUS | Status: DC
  Administered 2021-09-05 – 2021-09-06 (×3): 100 mg via INTRAVENOUS
  Filled 2021-09-05 (×4): qty 100

## 2021-09-05 MED ORDER — LACTATED RINGERS IV BOLUS (SEPSIS)
500.0000 mL | Freq: Once | INTRAVENOUS | Status: AC
  Administered 2021-09-05: 500 mL via INTRAVENOUS

## 2021-09-05 MED ORDER — PHENYLEPHRINE HCL-NACL 20-0.9 MG/250ML-% IV SOLN
25.0000 ug/min | INTRAVENOUS | Status: DC
  Administered 2021-09-05: 55 ug/min via INTRAVENOUS
  Administered 2021-09-05 – 2021-09-06 (×2): 25 ug/min via INTRAVENOUS
  Filled 2021-09-05 (×3): qty 250

## 2021-09-05 MED ORDER — SODIUM CHLORIDE 0.9 % IV SOLN: 10.0000 mL/h | Freq: Once | INTRAVENOUS | Status: DC

## 2021-09-05 MED ORDER — POLYETHYLENE GLYCOL 3350 17 G PO PACK: 17.0000 g | PACK | Freq: Every day | ORAL | Status: DC | PRN

## 2021-09-05 MED ORDER — HEPARIN SODIUM (PORCINE) 5000 UNIT/ML IJ SOLN
5000.0000 [IU] | Freq: Three times a day (TID) | INTRAMUSCULAR | Status: DC
  Administered 2021-09-05 – 2021-09-08 (×8): 5000 [IU] via SUBCUTANEOUS
  Filled 2021-09-05 (×8): qty 1

## 2021-09-05 MED ORDER — LACTATED RINGERS IV BOLUS (SEPSIS)
1000.0000 mL | Freq: Once | INTRAVENOUS | Status: AC
  Administered 2021-09-05: 1000 mL via INTRAVENOUS

## 2021-09-05 MED ORDER — CHLORHEXIDINE GLUCONATE CLOTH 2 % EX PADS
6.0000 | MEDICATED_PAD | Freq: Every day | CUTANEOUS | Status: DC
  Administered 2021-09-05 – 2021-09-12 (×8): 6 via TOPICAL

## 2021-09-05 MED ORDER — ONDANSETRON HCL 4 MG/2ML IJ SOLN
4.0000 mg | Freq: Four times a day (QID) | INTRAMUSCULAR | Status: DC | PRN
  Administered 2021-09-08 – 2021-09-10 (×2): 4 mg via INTRAVENOUS
  Filled 2021-09-05 (×2): qty 2

## 2021-09-05 MED ORDER — DOCUSATE SODIUM 100 MG PO CAPS: 100.0000 mg | ORAL_CAPSULE | Freq: Two times a day (BID) | ORAL | Status: DC | PRN

## 2021-09-05 MED ORDER — ACETAMINOPHEN 500 MG PO TABS
1000.0000 mg | ORAL_TABLET | Freq: Four times a day (QID) | ORAL | Status: DC | PRN
  Administered 2021-09-05: 1000 mg via ORAL
  Filled 2021-09-05: qty 2

## 2021-09-05 MED ORDER — METRONIDAZOLE 500 MG/100ML IV SOLN
500.0000 mg | Freq: Two times a day (BID) | INTRAVENOUS | Status: DC
  Administered 2021-09-05 – 2021-09-08 (×6): 500 mg via INTRAVENOUS
  Filled 2021-09-05 (×6): qty 100

## 2021-09-05 MED ORDER — SODIUM CHLORIDE 0.9% IV SOLUTION: Freq: Once | INTRAVENOUS | Status: AC

## 2021-09-05 MED ORDER — SODIUM CHLORIDE 0.9 % IV SOLN
250.0000 mL | INTRAVENOUS | Status: DC
  Administered 2021-09-05: 250 mL via INTRAVENOUS

## 2021-09-05 MED ORDER — POTASSIUM CHLORIDE CRYS ER 20 MEQ PO TBCR: 40.0000 meq | EXTENDED_RELEASE_TABLET | Freq: Once | ORAL | Status: DC

## 2021-09-05 MED ORDER — OXYCODONE HCL 5 MG PO TABS
5.0000 mg | ORAL_TABLET | Freq: Four times a day (QID) | ORAL | Status: DC | PRN
Start: 2021-09-05 — End: 2021-09-05

## 2021-09-05 MED ORDER — FENTANYL CITRATE (PF) 100 MCG/2ML IJ SOLN: 100.0000 ug | INTRAMUSCULAR | Status: DC | PRN

## 2021-09-05 MED ORDER — METRONIDAZOLE 500 MG/100ML IV SOLN
500.0000 mg | Freq: Once | INTRAVENOUS | Status: AC
  Administered 2021-09-05: 500 mg via INTRAVENOUS
  Filled 2021-09-05: qty 100

## 2021-09-05 MED ORDER — FENTANYL CITRATE PF 50 MCG/ML IJ SOSY
100.0000 ug | PREFILLED_SYRINGE | INTRAMUSCULAR | Status: DC | PRN
  Filled 2021-09-05: qty 2

## 2021-09-05 MED ORDER — SODIUM CHLORIDE 0.9 % IV SOLN
2.0000 g | Freq: Once | INTRAVENOUS | Status: AC
  Administered 2021-09-05: 2 g via INTRAVENOUS
  Filled 2021-09-05: qty 2

## 2021-09-05 MED ORDER — MAGNESIUM SULFATE 4 GM/100ML IV SOLN
4.0000 g | Freq: Once | INTRAVENOUS | Status: AC
  Administered 2021-09-05: 4 g via INTRAVENOUS
  Filled 2021-09-05: qty 100

## 2021-09-05 MED ORDER — SODIUM CHLORIDE 0.9 % IV BOLUS
1000.0000 mL | Freq: Once | INTRAVENOUS | Status: AC
  Administered 2021-09-05: 1000 mL via INTRAVENOUS

## 2021-09-05 MED ORDER — ONDANSETRON HCL 4 MG/2ML IJ SOLN
4.0000 mg | Freq: Once | INTRAMUSCULAR | Status: AC
  Administered 2021-09-05: 4 mg via INTRAVENOUS
  Filled 2021-09-05: qty 2

## 2021-09-05 MED ORDER — FENTANYL CITRATE PF 50 MCG/ML IJ SOSY
50.0000 ug | PREFILLED_SYRINGE | Freq: Once | INTRAMUSCULAR | Status: AC
  Administered 2021-09-05: 50 ug via INTRAVENOUS
  Filled 2021-09-05: qty 1

## 2021-09-05 MED ORDER — LACTATED RINGERS IV SOLN: INTRAVENOUS | Status: AC

## 2021-09-05 MED ORDER — OXYCODONE HCL 5 MG PO TABS
5.0000 mg | ORAL_TABLET | Freq: Four times a day (QID) | ORAL | Status: DC | PRN
Start: 2021-09-05 — End: 2021-09-12
  Administered 2021-09-05 – 2021-09-10 (×7): 5 mg via ORAL
  Filled 2021-09-05 (×7): qty 1

## 2021-09-05 MED ORDER — ONDANSETRON HCL 4 MG PO TABS: 4.0000 mg | ORAL_TABLET | Freq: Four times a day (QID) | ORAL | Status: DC | PRN

## 2021-09-05 MED ORDER — LIP MEDEX EX OINT
TOPICAL_OINTMENT | CUTANEOUS | Status: DC | PRN
  Filled 2021-09-05: qty 7

## 2021-09-05 NOTE — H&P (Addendum)
NAME:  Hannah [name], MRN:  [id number], DOB:  Nov 23, 1985, LOS: 0 ADMISSION DATE:  09/04/2021, CONSULTATION DATE:  09/05/2021 REFERRING MD:  Dr. Candiss Norse, CHIEF COMPLAINT:  Hypotension   History of Present Illness:  36 year old female with history as below who presented 2/17 to [facility] ER after feeling unwell on 2/11 with 2 week history of lower abdominal and back with one day history of subjective fever 103F, chills, nausea, vomiting, and diarrhea.  Reported light vaginal discharge present since undergoing a Sonata procedure (hysteroscopy with radiofrequency ablation) for uterine fibroids on 1/16 but in the last two weeks has become more thick and copious.  In ER, she was febrile, hypotensive, tachycardic and noted to have AKI and elevated lactic acid.  She was given IV fluids, cultures sent, and started on empiric cefepime.  CT abd/ pelvis did not show anything acute, no hydronephrosis or nephrolithiasis but noted enlarged appearance of left ovary with surrounding stranding, recommending ultrasound with duplex to exclude ovarian torsion.  She underwent US duplex which did not show evidence of ovarian torsion, noted uterine body fibroid with reduced size from prior.  Wet prep showing possible bacterial vaginosis.  Negative for chlamydia/ gonorrhea.  UA/ CXR neg.  OB/ GYN to admit patient for sepsis secondary to presumed PID as no other obvious source.  Abx expanded to ceftriaxone, doxycycline, and metronidazole.  Repeat labs showing clearance of lactic with sCr improving 3.33> 2.47 however Hgb noted to drop 8.1> 6.6 (she was 9.8 on 08/04/21).  She had acute worsening of symptoms this morning, becoming tachycardic into 170s, febrile, worsening abdominal pain, and increased work of breathing.  Given Hgb drop and concern for ovarian torsion, patient was emergently transferred to [facility] ER for OB/ GYN evaluation.   TRH consulted to help with further medical management.  History corroborated by mom at the  bedside  Pertinent  Medical History  IDA, heavy menstrual bleeding, uterine fibroids (GYN- Dr. Brien Mates with [facility])  [facility] Events: Including procedures, antibiotic start and stop dates in addition to other pertinent events     09/05/21 CT abd/ pelvis > 1. No hydronephrosis or nephrolithiasis. 2. Enlarged appearance of the left ovary with surrounding stranding.  Further evaluation with ultrasound with duplex interrogation of ovarian flow recommended to exclude torsion. 3. No bowel obstruction. Normal appendix.  Interim History / Subjective:  Critically ill, tachycardic 120s, soft blood pressure low 90s  Objective   Blood pressure (!) 81/46, pulse (!) 129, temperature (!) 101 F (38.3 C), temperature source Oral, resp. rate (!) 32, height 5' 2"  (1.575 m), weight 81.6 kg, SpO2 100 %.       No intake or output data in the 24 hours ending 09/05/21 1429 Filed Weights   09/04/21 2247  Weight: 81.6 kg    Examination: General: Acutely ill-appearing young woman, lying supine, no distress HENT: Mild pallor, no icterus, no JVD Lungs: No accessory muscle use, clear breath sounds bilateral, no rhonchi Cardiovascular: S1-S2 regular, tacky Abdomen: Soft, minimal tenderness left lower quadrant, no guarding no rigidity Extremities: No edema, no deformity Neuro: Alert, interactive, nonfocal   Labs show resolved lactate from 3.3-0.9, persistent leukocytosis, decrease in hemoglobin to 6.6, mild hyponatremia, decreased creatinineFrom 3.3-2.4  Resolved Hospital Problem list    Assessment & Plan:   Septic shock, likely hypovolemia due to vomiting/diarrhea Likely source pelvic inflammatory disease although atypical presentation few weeks after hysteroscopy procedure -No evidence for ovarian torsion on ultrasound pelvis with Doppler  -Has received  4 L of fluid, lactate resolved but persistent hypotension -Use peripheral Neo-Synephrine [given tachycardia] for goal SBP  more than 90 or MAP more than 65 -Continue Rocephin, doxycycline and metronidazole  AKI -related to sepsis, hypotension -Improving creatinine with fluid resuscitation -Continue IV fluids at 150 cc an hour, serial BMET -Avoid nephrotoxins  Anemia of chronic blood loss due to menorrhagia + hemodilution -Microcytic -Transfuse 1 unit PRBC, goal hemoglobin 7 and above -No evidence of bleeding on CT or ultrasound   Hypomagnesemia  Hypokalemia Hyponatremia -Replete, follow  Hyperbilirubinemia, elevated alk phos - no abnormalities noted to CT abd/pelvis.  Improved on repeat labs.    Admit to ICU for hemodynamic monitoring, PCCM can be primary, once hemodynamic issues resolved, can transfer to GYN service  Best Practice (right click and "Reselect all SmartList Selections" daily)   Diet/type: clear liquids DVT prophylaxis: prophylactic heparin  GI prophylaxis: N/A Lines: N/A Foley:  N/A Code Status:  full code Last date of multidisciplinary goals of care discussion [NA]  Labs   CBC: Recent Labs  Lab 09/04/21 2309 09/05/21 0851  WBC 16.1* 16.9*  NEUTROABS 14.8* 15.3*  HGB 8.1* 6.6*  HCT 27.2* 22.5*  MCV 62.4* 61.5*  PLT 311 161    Basic Metabolic Panel: Recent Labs  Lab 09/04/21 2309 09/05/21 0851  NA 133* 132*  K 3.4* 3.6  CL 101 104  CO2 17* 18*  GLUCOSE 140* 98  BUN 26* 32*  CREATININE 3.33* 2.47*  CALCIUM 8.2* 7.6*  MG  --  1.4*   GFR: Estimated Creatinine Clearance: 31.8 mL/min (A) (by C-G formula based on SCr of 2.47 mg/dL (H)). Recent Labs  Lab 09/04/21 2309 09/05/21 0033 09/05/21 0606 09/05/21 0851  WBC 16.1*  --   --  16.9*  LATICACIDVEN 3.3* 2.6* 1.1 0.9    Liver Function Tests: Recent Labs  Lab 09/04/21 2309 09/05/21 0851  AST 23 18  ALT 15 14  ALKPHOS 130* 85  BILITOT 1.9* 1.0   1.0  PROT 8.0 6.4*  ALBUMIN 3.0* 2.3*   No results for input(s): LIPASE, AMYLASE in the last 168 hours. No results for input(s): AMMONIA in the last 168  hours.  ABG No results found for: PHART, PCO2ART, PO2ART, HCO3, TCO2, ACIDBASEDEF, O2SAT   Coagulation Profile: Recent Labs  Lab 09/04/21 2309  INR 1.4*    Cardiac Enzymes: No results for input(s): CKTOTAL, CKMB, CKMBINDEX, TROPONINI in the last 168 hours.  HbA1C: No results found for: HGBA1C  CBG: No results for input(s): GLUCAP in the last 168 hours.  Review of Systems:   Lower abdominal pain Nausea, vomiting Loose stools, with incontinence Fever Vaginal discharge initially bloody, no foul smell  Constitutional: Positive for anorexia, fevers and sweats  Eyes: negative for irritation, redness and visual disturbance  Ears, nose, mouth, throat, and face: negative for earaches, epistaxis, nasal congestion and sore throat  Respiratory: negative for cough, dyspnea on exertion, sputum and wheezing  Cardiovascular: negative for chest pain, dyspnea, lower extremity edema, orthopnea, palpitations and syncope  Gastrointestinal: negative for  constipation, melena, nausea and vomiting  Genitourinary:negative for dysuria, frequency and hematuria  Hematologic/lymphatic: negative for bleeding, easy bruising and lymphadenopathy  Musculoskeletal:negative for arthralgias, muscle weakness and stiff joints  Neurological: negative for coordination problems, gait problems, headaches and weakness  Endocrine: negative for diabetic symptoms including polydipsia, polyuria and weight loss   Past Medical History:  She,  has a past medical history of Anemia and No pertinent past medical history.   Surgical  History:   Past Surgical History:  Procedure Laterality Date   CESAREAN SECTION     CESAREAN SECTION  04/08/2012   Procedure: CESAREAN SECTION;  Surgeon: Daria Pastures, MD;  Location: [facility];  Service: Obstetrics;  Laterality: N/A;  Repeat Cesarean Section Delivery Boy @ [phone],   DILATATION & CURETTAGE/HYSTEROSCOPY WITH MYOSURE N/A 08/04/2021   Procedure: HYSTEROSCOPIC Jacklynn Barnacle MYOMECTOMY;   Surgeon: Rowland Lathe, MD;  Location: [facility];  Service: Gynecology;  Laterality: N/A;     Social History:   reports that she has never smoked. She does not have any smokeless tobacco history on file. She reports that she does not drink alcohol and does not use drugs.   Family History:  Her family history includes Hypertension in her mother. There is no history of Other.   Allergies Allergies  Allergen Reactions   Prednisone Swelling     Home Medications  Prior to Admission medications   Medication Sig Start Date End Date Taking? Authorizing Provider  Ferrous Sulfate (SLOW FE) 142 (45 Fe) MG TBCR Take 142 mg by mouth daily.   Yes [provider]  ibuprofen (ADVIL) 200 MG tablet Take 3 tablets (600 mg total) by mouth every 6 (six) hours as needed for cramping. Patient taking differently: Take 800 mg by mouth every 6 (six) hours as needed for cramping. 08/04/21  Yes Rowland Lathe, MD     Critical care time: 95m   RKara MeadMD. FCap[facility] Pager : [phone]  If no response to pager , please call [phone] until 7 pm After 7:00 pm call Elink  [phone]  09/05/2021

## 2021-09-05 NOTE — ED Notes (Signed)
Charge Nurse Patty, notified of pt's status change. EDP Dr. Sherry Ruffing notified and aware and at the bedside.

## 2021-09-05 NOTE — Consult Note (Signed)
Initial Consultation Note   Patient: Hannah [name] MCN:[id number] DOB: Dec 08, 1985 PCP: Ob/Gyn, Esmond Plants DOA: 09/04/2021 DOS: the patient was seen and examined on 09/05/2021 Primary service: Jerelyn Charles, MD  Referring physician: Dr. Carlis Abbott Reason for consult: AKI, Sepsis  Assessment/Plan: Assessment and Plan: No notes have been filed under this hospital service. Service: Hospitalist Sepsis     - admitted to OBGyn service; going to [facility]     - they have started rocephin, doxy, flagyl per their protocol     - follow lactic acid, bld cx     - continue fluids     - pressures are ok     - CMP, CBC pending  AKI     - likely secondary to combination of sepsis and NSAID use     - CT is negative for renal/urinary obstruction     - UA does show some proteinuria; this can be followed up outpt     - for now, continue fluids; hold NSAIDs, watch nephrotoxins  Hypokalemia     - replace K+, check Mg2+  Elevated alk phos Hyperbilirubinemia     - likely secondary to sepsis; can check fractionated bili, otherwise, trend for now     - liver and biliary tree normal on CT  Remainder per primary team  TR[facility] will continue to follow the patient.  HPI: Hannah [name] is a 36 y.o. female with past medical history of anemia. Presenting with abdominal pain. She had an outpt surgery for fibroids 08/04/21. For the first 2 weeks after, she felt fine. However 2 weeks ago, she started having LLQ/suprapubic/low back pain. It was sharp pain that was constant. She tried APAP, but it didn't help. Around 5 days ago, she called her Gyn team and was prescribed ibuprofen 800 mg TID. She has taken that every day since, but has not had any relief. She began to have N/V/F yesterday, so she decided to come to the ED for help. She denies any other aggravating or alleviating factors.   Review of Systems: As mentioned in the history of present illness. All other systems reviewed and are negative. Past Medical History:   Diagnosis Date   Anemia    No pertinent past medical history    Past Surgical History:  Procedure Laterality Date   CESAREAN SECTION     CESAREAN SECTION  04/08/2012   Procedure: CESAREAN SECTION;  Surgeon: Daria Pastures, MD;  Location: [facility];  Service: Obstetrics;  Laterality: N/A;  Repeat Cesarean Section Delivery Boy @ [phone],   DILATATION & CURETTAGE/HYSTEROSCOPY WITH MYOSURE N/A 08/04/2021   Procedure: HYSTEROSCOPIC Jacklynn Barnacle MYOMECTOMY;  Surgeon: Rowland Lathe, MD;  Location: [facility];  Service: Gynecology;  Laterality: N/A;   Social History:  reports that she has never smoked. She does not have any smokeless tobacco history on file. She reports that she does not drink alcohol and does not use drugs.  Allergies  Allergen Reactions   Prednisone Swelling    Family History  Problem Relation Age of Onset   Hypertension Mother    Other Neg Hx     Prior to Admission medications   Medication Sig Start Date End Date Taking? Authorizing Provider  Ferrous Sulfate (SLOW FE) 142 (45 Fe) MG TBCR Take 142 mg by mouth daily.   Yes [provider]  ibuprofen (ADVIL) 200 MG tablet Take 3 tablets (600 mg total) by mouth every 6 (six) hours as needed for cramping. Patient taking differently: Take 800  mg by mouth every 6 (six) hours as needed for cramping. 08/04/21  Yes Rowland Lathe, MD    Physical Exam: Vitals:   09/05/21 0600 09/05/21 0630 09/05/21 0735 09/05/21 0802  BP: 107/76 99/70 117/75 113/76  Pulse: 99 99 (!) 105 (!) 103  Resp: (!) 21 (!) 24 (!) 28 12  Temp:    98.2 F (36.8 C)  TempSrc:    Oral  SpO2: 100% 100% 100% 100%  Weight:      Height:       General: 36 y.o. female resting in bed in NAD Eyes: PERRL, normal sclera ENMT: Nares patent w/o discharge, orophaynx clear, dentition normal, ears w/o discharge/lesions/ulcers Neck: Supple, trachea midline Cardiovascular: RRR, +S1, S2, no m/g/r, equal pulses throughout Respiratory:  CTABL, no w/r/r, normal WOB GI: BS+, ND, TTP LLQ, Left flank, no masses noted, no organomegaly noted MSK: No e/c/c Neuro: A&O x 3, no focal deficits Psyc: Appropriate interaction and affect, calm/cooperative  Data Reviewed:   K+ 3.4 CO2 17 BUN 26 Scr 3.33 Alk Phos 130 T bili 1.9 Lactic acid 3.3 -> 2.6 -> 1.1  WBC 16.1 Hgb 8.1  CT ab/pelvis: 1. No hydronephrosis or nephrolithiasis. 2. Enlarged appearance of the left ovary with surrounding stranding. Further evaluation with ultrasound with duplex interrogation of ovarian flow recommended to exclude torsion. 3. No bowel obstruction. Normal appendix.   Family Communication: None at bedside. Thank you very much for involving [place] in the care of your patient.  Author: Jonnie Finner, DO 09/05/2021 8:25 AM  For on call review [email or web address].

## 2021-09-05 NOTE — ED Notes (Signed)
[facility] here to take pt.  

## 2021-09-05 NOTE — ED Notes (Signed)
LR fluid bolus initiated, per Dr. Sherry Ruffing EDP's verbal order.

## 2021-09-05 NOTE — ED Notes (Signed)
Pt with sudden onset of chills, increased respiratory rate at 30-40x/min, tachy in the 150's, afebrile with a stable BP of 130/88. Dr. Carlis Abbott, OBGYN, paged and aware.

## 2021-09-05 NOTE — ED Notes (Signed)
Unit secretary notified to page Dr. Carlis Abbott regarding status change.

## 2021-09-05 NOTE — Progress Notes (Signed)
A consult was received from an ED physician for Cefepime per pharmacy dosing.  The patient's profile has been reviewed for ht/wt/allergies/indication/available labs.    A one time order has been placed for Cefepime 2gm IV.    Further antibiotics/pharmacy consults should be ordered by admitting physician if indicated.                       Thank you, Everette Rank, PharmD 09/05/2021  12:08 AM

## 2021-09-05 NOTE — Sepsis Progress Note (Signed)
Following per sepsis protocol   

## 2021-09-05 NOTE — ED Provider Notes (Signed)
10:09 AM Nursing called into the patient's room several minutes ago for acute worsening of symptoms.  Patient's are having worsened abdominal pain, heart rate jumped into the 170s, she was having increased work of breathing.  OB/GYN team was called who feels patient now needs ED to ED transfer to [facility] so they can evaluate her to discuss level of care needs as initially she was plan to get a MedSurg bed for concern for sepsis from PID after having a gynecological surgery several weeks ago.  Shortly after the OB/GYN team made this recommendation, patient's hemoglobin returned at 6.6 down from 8.1 several hours ago.  With this acute change I am concerned that patient is having abdominal hemorrhage.  We will order blood for the patient and give her 2 units.  Will call OB/GYN team again to discuss if they feel she needs to go to operating room for further evaluation.  10:22 AM I spoke with radiology on the phone about another patient and had him take another look at the imaging overnight now with the new information that patient could be bleeding with dropping hemoglobin.  He reviewed it and did not see evidence of active bleeding on the noncontrasted scan given the patient's kidneys but he did show some concern for the asymmetry of the ovaries with some haziness on the enlarged one concerning for inflammation.  He reports that although there is flow on the Doppler it is possible to have torsion with good flow intermittently.  I called Dr. Marius Ditch with OB/GYN who wants the patient emergently transferred ED to ED to be assessed by them.  She agrees with blood transfusion after crossmatch.  Heart rate is slightly improving into around 150 and she is not hypotensive at this time.  We will hold on emergent blood but will give crossmatch blood.  She does not want to take patient to OR until she sees her so we will do ED to ED transfer for OB/GYN evaluation.  Spoke with Dr. Billy Fischer who agrees to except the patient  in transfer.  We will go ED to ED for OB/GYN eval.   CRITICAL CARE Performed by: Gwenyth Allegra Jacion Dismore Total critical care time: 35 minutes Critical care time was exclusive of separately billable procedures and treating other patients. Critical care was necessary to treat or prevent imminent or life-threatening deterioration. Critical care was time spent personally by me on the following activities: development of treatment plan with patient and/or surrogate as well as nursing, discussions with consultants, evaluation of patient's response to treatment, examination of patient, obtaining history from patient or surrogate, ordering and performing treatments and interventions, ordering and review of laboratory studies, ordering and review of radiographic studies, pulse oximetry and re-evaluation of patient's condition.    Keivon Garden, Gwenyth Allegra, MD 09/05/21 1028

## 2021-09-05 NOTE — Progress Notes (Signed)
Spoke with [employer], stated that vasopressor is on hold, and patient will not need the USPIV at this time. Advised to reconsult for PIV start.

## 2021-09-05 NOTE — Progress Notes (Signed)
Notified by nursing that the patient has become hypotensive. 1L NS bolus ordered. Her lab work is showing a worsening anemia. 2units pRBCs ordered. Nursing to update primary team. [facility] team at [facility] are evaluating her. We have paged PCCM to eval as well. Appreciate their assistance. Continue current abx.  Jonnie Finner, DO

## 2021-09-05 NOTE — ED Notes (Signed)
Patient ambulated to restroom.

## 2021-09-05 NOTE — ED Notes (Signed)
HgB 6.6 received from lab. Dr. Sherry Ruffing at the bedside at this time and aware.

## 2021-09-05 NOTE — ED Notes (Signed)
Pt attached to cardiac monitor x3. A&O x4. VSS at this time.

## 2021-09-05 NOTE — ED Notes (Signed)
US at bedside

## 2021-09-05 NOTE — ED Notes (Signed)
[facility] en route at this time.

## 2021-09-05 NOTE — Progress Notes (Signed)
Contacted Dr Elsworth Soho to address 2nd unit of PRBC's; Ordered to hold off on giving 2nd unit at this time. Will recheck HGB in 2 hours.   Loni Muse, RN

## 2021-09-05 NOTE — ED Notes (Addendum)
Pt to be transported ED to ED emergently per Dr. Sherry Ruffing EDP's verbal order and Dr. Marius Ditch, OBGYN's verbal order. Charge RN aware and to page [facility] for transport.

## 2021-09-05 NOTE — ED Notes (Signed)
Pt departed the dept via [facility] to [facility] at this time.

## 2021-09-05 NOTE — Progress Notes (Signed)
Patient ID: Hannah [name], female   DOB: 1985-07-26, 36 y.o.   MRN: [id number]  I was called and informed pt was in ED at [facility] and had sudden onset of fever, chills, and tachycardia.  I was on L?D at [facility] and advised I would be unable to leave to see her thus if pt was stable for transfer, to send her to [facility].   I was then called at 2:37pm while in a delivery and informed pt was in [facility] and hypotensive. ICU team had been notified and were evaluating pt.    I saw pt in ED after my delivery and performed an assessment  Pt reports improved abdominal pain after received oxycodone and tylenol, denies dizziness, lightheadness, fever. She had a BM while at [facility] and again here. No nausea/vomiting at this time. Vaginal bleeding minimal with slight brown tinge.   VS: 86/54, HR 120s, R 33 GEN - NAD, conversant, full uninterrupted sentences ABD - soft, mild distension, nontender, minimal BS in all quadrants.             No massess palpated GU - slight pink tinged discharge on pad ( x 3 hrs) , no odor  EXT - no homans , no edema    A/P: [id number] female on POD # 32 following Sonata procedure  1.Septic Sh[place] ? due to PID remote from surgery ( 18month) - admitted to ICU  - IV antibiotics/IVF/Neo synephrine   2. AKI: avoid nephrotoxins   3. Anemia: receiving 1u prbc                    - no evidence of acute abdomen                     - consider repeat imaging if worsening anemia   Discussed care with Dr Davonna Belling ( CCM) - once pt stabilized will have care transferred back to [place] involvement and management of pt

## 2021-09-05 NOTE — ED Notes (Signed)
This nurse spoke with Dr. Marius Ditch, OBGYN, regarding pt's status change. Dr. Marius Ditch unaware pt is holding in [facility]. This nurse discussed with Dr. Marius Ditch, per EDP Dr. Doreene Burke verbal recommendation at the bedside, no OB in house, pt can be transferred ED to ED to [facility]. Dr. Marius Ditch and Dr. Sherry Ruffing in agreement of pt transfer to [facility].

## 2021-09-05 NOTE — ED Provider Notes (Signed)
Hannah [name]   CSN: [id number] Arrival date & time: 09/04/21  2240     History  Chief Complaint  Patient presents with   Fever    Hannah [name] is a 36 y.o. female.  The history is provided by the patient and medical records.  Fever Hannah [name] is a 36 y.o. female who presents to the Emergency Department complaining of abdominal pain, vomiting and diarrhea.  She presents to the emergency department for evaluation of vomiting and diarrhea that started today.  She states that she has been feeling unwell since Saturday of this week.  Subjective fever starting today.  2 weeks ago she developed lower abdominal discomfort as well as low back discomfort.  Pain has been predominantly midline.  It is worse when she is trying to sleep at night.  No dysuria.  She does have vaginal discharge that she describes as light.  This has been present since she had a Sonata procedure performed on January 16.  She has a history of iron deficiency anemia, no additional medical issues.  No prior similar symptoms.  Sees Dr. Brien Mates with [place] Medications Prior to Admission medications   Medication Sig Start Date End Date Taking? Authorizing Provider  Ferrous Sulfate (SLOW FE) 142 (45 Fe) MG TBCR Take 142 mg by mouth daily.   Yes [provider]  ibuprofen (ADVIL) 200 MG tablet Take 3 tablets (600 mg total) by mouth every 6 (six) hours as needed for cramping. Patient taking differently: Take 800 mg by mouth every 6 (six) hours as needed for cramping. 08/04/21  Yes Rowland Lathe, MD      Allergies    Prednisone    Review of Systems   Review of Systems  Constitutional:  Positive for fever.  All other systems reviewed and are negative.  Physical Exam Updated Vital Signs BP 117/75    Pulse (!) 105    Temp 98.1 F (36.7 C) (Oral)    Resp (!) 28    Ht 5\' 2"  (1.575 m)    Wt 81.6 kg    SpO2 100%    BMI 32.92 kg/m   Physical Exam Vitals and nursing [name] reviewed.  Constitutional:      Appearance: She is well-developed. She is ill-appearing.  HENT:     Head: Normocephalic and atraumatic.  Cardiovascular:     Rate and Rhythm: Regular rhythm. Tachycardia present.     Heart sounds: No murmur heard. Pulmonary:     Effort: Pulmonary effort is normal. No respiratory distress.     Breath sounds: Normal breath sounds.  Abdominal:     Palpations: Abdomen is soft.     Tenderness: There is no guarding or rebound.     Comments: Nno significant epigastric tenderness to palpation.  Moderate to severe LLQ tenderness, mild to moderate RLQ tenderness  Genitourinary:    Comments: Moderate thick purulent vaginal discharge Musculoskeletal:        General: No tenderness.  Skin:    General: Skin is warm and dry.  Neurological:     Mental Status: She is alert and oriented to person, place, and time.  Psychiatric:        Behavior: Behavior normal.    ED Results / Procedures / Treatments   Labs (all labs ordered are listed, but only abnormal results are displayed) Labs Reviewed  WET PREP, GENITAL - Abnormal; Notable for the following components:  Result Value   Clue Cells Wet Prep HPF POC PRESENT (*)    WBC, Wet Prep HPF POC >=10 (*)    All other components within normal limits  LACTIC ACID, PLASMA - Abnormal; Notable for the following components:   Lactic Acid, Venous 3.3 (*)    All other components within normal limits  LACTIC ACID, PLASMA - Abnormal; Notable for the following components:   Lactic Acid, Venous 2.6 (*)    All other components within normal limits  COMPREHENSIVE METABOLIC PANEL - Abnormal; Notable for the following components:   Sodium 133 (*)    Potassium 3.4 (*)    CO2 17 (*)    Glucose, Bld 140 (*)    BUN 26 (*)    Creatinine, Ser 3.33 (*)    Calcium 8.2 (*)    Albumin 3.0 (*)    Alkaline Phosphatase 130 (*)    Total Bilirubin 1.9 (*)    GFR, Estimated 18 (*)    All other  components within normal limits  CBC WITH DIFFERENTIAL/PLATELET - Abnormal; Notable for the following components:   WBC 16.1 (*)    Hemoglobin 8.1 (*)    HCT 27.2 (*)    MCV 62.4 (*)    MCH 18.6 (*)    MCHC 29.8 (*)    RDW 17.7 (*)    Neutro Abs 14.8 (*)    Lymphs Abs 0.2 (*)    Abs Immature Granulocytes 0.60 (*)    All other components within normal limits  PROTIME-INR - Abnormal; Notable for the following components:   Prothrombin Time 16.9 (*)    INR 1.4 (*)    All other components within normal limits  APTT - Abnormal; Notable for the following components:   aPTT 46 (*)    All other components within normal limits  URINALYSIS, ROUTINE W REFLEX MICROSCOPIC - Abnormal; Notable for the following components:   Color, Urine AMBER (*)    APPearance CLOUDY (*)    Glucose, UA 50 (*)    Hgb urine dipstick SMALL (*)    Protein, ur >=300 (*)    Bacteria, UA RARE (*)    All other components within normal limits  RESP PANEL BY RT-PCR (FLU A&B, COVID) ARPGX2  CULTURE, BLOOD (ROUTINE X 2)  CULTURE, BLOOD (ROUTINE X 2)  URINE CULTURE  HCG, QUANTITATIVE, PREGNANCY  LACTIC ACID, PLASMA  LACTIC ACID, PLASMA  BASIC METABOLIC PANEL  GC/CHLAMYDIA PROBE AMP ([place]) NOT AT [facility]    EKG EKG Interpretation  Date/Time:  Thursday September 04 2021 23:22:39 EST Ventricular Rate:  120 PR Interval:  127 QRS Duration: 88 QT Interval:  450 QTC Calculation: 636 R Axis:   40 Text Interpretation: Sinus tachycardia Probable left atrial enlargement Prolonged QT interval Confirmed by Quintella Reichert [phone]) on 09/04/2021 11:46:00 PM  Radiology CT Abdomen Pelvis Wo Contrast  Result Date: 09/05/2021 CLINICAL DATA:  Left lower quadrant tenderness.  Sepsis. EXAM: CT ABDOMEN AND PELVIS WITHOUT CONTRAST TECHNIQUE: Multidetector CT imaging of the abdomen and pelvis was performed following the standard protocol without IV contrast. RADIATION DOSE REDUCTION: This exam was performed according to the  departmental dose-optimization program which includes automated exposure control, adjustment of the mA and/or kV according to patient size and/or use of iterative reconstruction technique. COMPARISON:  Pelvic MRI dated 05/03/2021. FINDINGS: Evaluation of this exam is limited in the absence of intravenous contrast. Lower chest: The visualized lung bases are clear. No intra-abdominal free air or free fluid. Hepatobiliary: No focal liver  abnormality is seen. No gallstones, gallbladder wall thickening, or biliary dilatation. Pancreas: Unremarkable. No pancreatic ductal dilatation or surrounding inflammatory changes. Spleen: Normal in size without focal abnormality. Adrenals/Urinary Tract: The adrenal glands unremarkable. There is no hydronephrosis or nephrolithiasis on either side. The visualized ureters and urinary bladder appear unremarkable. Stomach/Bowel: There is no bowel obstruction or active inflammation. The appendix is normal. Vascular/Lymphatic: The abdominal aorta and IVC unremarkable on this noncontrast CT. No portal venous gas. There is no adenopathy. Reproductive: The uterus is mildly enlarged. There is apparent enlargement of the left adnexa with surrounding stranding. This is suboptimally evaluated on this noncontrast CT. And ovarian torsion is not excluded. Further evaluation with ultrasound with duplex interrogation of flow to the ovaries is recommended. Other: None Musculoskeletal: No acute or significant osseous findings. IMPRESSION: 1. No hydronephrosis or nephrolithiasis. 2. Enlarged appearance of the left ovary with surrounding stranding. Further evaluation with ultrasound with duplex interrogation of ovarian flow recommended to exclude torsion. 3. No bowel obstruction. Normal appendix. Electronically Signed   By: Anner Crete M.D.   On: 09/05/2021 01:25   US Transvaginal Non-OB  Result Date: 09/05/2021 CLINICAL DATA:  Pelvic pain EXAM: TRANSABDOMINAL AND TRANSVAGINAL ULTRASOUND OF PELVIS  DOPPLER ULTRASOUND OF OVARIES TECHNIQUE: Both transabdominal and transvaginal ultrasound examinations of the pelvis were performed. Transabdominal technique was performed for global imaging of the pelvis including uterus, ovaries, adnexal regions, and pelvic cul-de-sac. It was necessary to proceed with endovaginal exam following the transabdominal exam to visualize the endometrium. Color and duplex Doppler ultrasound was utilized to evaluate blood flow to the ovaries. COMPARISON:  CT abdomen/pelvis dated 09/05/2021. MR pelvis dated 05/03/2021. FINDINGS: Uterus Measurements: 15.0 x 6.4 x 7.8 cm = volume: 391 mL. 5.5 x 5.4 x 5.4 cm intramural/mucosal fibroid in the uterine body, previously 7.9 cm on MR. Endometrium Thickness: 4 mm. Poorly visualized/distorted by the dominant fibroid. Right ovary Measurements: 4.1 x 2.5 x 3.0 cm = volume: 16.0 mL. Normal appearance/no adnexal mass. Left ovary Measurements: 5.3 x 3.9 x 4.2 cm = volume: 45.6 mL. 3.1 x 1.9 x 1.8 cm involuting corpus luteal cyst, physiologic/benign. No follow-up recommended. Pulsed Doppler evaluation of both ovaries demonstrates normal low-resistance arterial and venous waveforms. Other findings No abnormal free fluid. IMPRESSION: 5.5 cm intramural/mucosal fibroid in the uterine body, improved from prior MR. Otherwise negative pelvic ultrasound. No evidence of ovarian torsion. Electronically Signed   By: Julian Hy M.D.   On: 09/05/2021 03:23   US Pelvis Complete  Result Date: 09/05/2021 CLINICAL DATA:  Pelvic pain EXAM: TRANSABDOMINAL AND TRANSVAGINAL ULTRASOUND OF PELVIS DOPPLER ULTRASOUND OF OVARIES TECHNIQUE: Both transabdominal and transvaginal ultrasound examinations of the pelvis were performed. Transabdominal technique was performed for global imaging of the pelvis including uterus, ovaries, adnexal regions, and pelvic cul-de-sac. It was necessary to proceed with endovaginal exam following the transabdominal exam to visualize the  endometrium. Color and duplex Doppler ultrasound was utilized to evaluate blood flow to the ovaries. COMPARISON:  CT abdomen/pelvis dated 09/05/2021. MR pelvis dated 05/03/2021. FINDINGS: Uterus Measurements: 15.0 x 6.4 x 7.8 cm = volume: 391 mL. 5.5 x 5.4 x 5.4 cm intramural/mucosal fibroid in the uterine body, previously 7.9 cm on MR. Endometrium Thickness: 4 mm. Poorly visualized/distorted by the dominant fibroid. Right ovary Measurements: 4.1 x 2.5 x 3.0 cm = volume: 16.0 mL. Normal appearance/no adnexal mass. Left ovary Measurements: 5.3 x 3.9 x 4.2 cm = volume: 45.6 mL. 3.1 x 1.9 x 1.8 cm involuting corpus luteal cyst, physiologic/benign. No  follow-up recommended. Pulsed Doppler evaluation of both ovaries demonstrates normal low-resistance arterial and venous waveforms. Other findings No abnormal free fluid. IMPRESSION: 5.5 cm intramural/mucosal fibroid in the uterine body, improved from prior MR. Otherwise negative pelvic ultrasound. No evidence of ovarian torsion. Electronically Signed   By: Julian Hy M.D.   On: 09/05/2021 03:23   [place] Art/Ven Flow Abd Pelv Doppler  Result Date: 09/05/2021 CLINICAL DATA:  Pelvic pain EXAM: TRANSABDOMINAL AND TRANSVAGINAL ULTRASOUND OF PELVIS DOPPLER ULTRASOUND OF OVARIES TECHNIQUE: Both transabdominal and transvaginal ultrasound examinations of the pelvis were performed. Transabdominal technique was performed for global imaging of the pelvis including uterus, ovaries, adnexal regions, and pelvic cul-de-sac. It was necessary to proceed with endovaginal exam following the transabdominal exam to visualize the endometrium. Color and duplex Doppler ultrasound was utilized to evaluate blood flow to the ovaries. COMPARISON:  CT abdomen/pelvis dated 09/05/2021. MR pelvis dated 05/03/2021. FINDINGS: Uterus Measurements: 15.0 x 6.4 x 7.8 cm = volume: 391 mL. 5.5 x 5.4 x 5.4 cm intramural/mucosal fibroid in the uterine body, previously 7.9 cm on MR. Endometrium Thickness: 4  mm. Poorly visualized/distorted by the dominant fibroid. Right ovary Measurements: 4.1 x 2.5 x 3.0 cm = volume: 16.0 mL. Normal appearance/no adnexal mass. Left ovary Measurements: 5.3 x 3.9 x 4.2 cm = volume: 45.6 mL. 3.1 x 1.9 x 1.8 cm involuting corpus luteal cyst, physiologic/benign. No follow-up recommended. Pulsed Doppler evaluation of both ovaries demonstrates normal low-resistance arterial and venous waveforms. Other findings No abnormal free fluid. IMPRESSION: 5.5 cm intramural/mucosal fibroid in the uterine body, improved from prior MR. Otherwise negative pelvic ultrasound. No evidence of ovarian torsion. Electronically Signed   By: Julian Hy M.D.   On: 09/05/2021 03:23   DG Chest Port 1 View  Result Date: 09/04/2021 CLINICAL DATA:  Possible sepsis.  Nausea and vomiting. EXAM: PORTABLE CHEST 1 VIEW COMPARISON:  None. FINDINGS: The heart size and mediastinal contours are within normal limits. Both lungs are clear. The visualized skeletal structures are unremarkable. IMPRESSION: No active disease. Electronically Signed   By: Brett Fairy M.D.   On: 09/04/2021 23:22    Procedures Procedures   CRITICAL CARE Performed by: Quintella Reichert   Total critical care time: 45 minutes  Critical care time was exclusive of separately billable procedures and treating other patients.  Critical care was necessary to treat or prevent imminent or life-threatening deterioration.  Critical care was time spent personally by me on the following activities: development of treatment plan with patient and/or surrogate as well as nursing, discussions with consultants, evaluation of patient's response to treatment, examination of patient, obtaining history from patient or surrogate, ordering and performing treatments and interventions, ordering and review of laboratory studies, ordering and review of radiographic studies, pulse oximetry and re-evaluation of patient's condition.  Medications Ordered in  ED Medications  lactated ringers infusion ( Intravenous New Bag/Given 09/05/21 0350)  ondansetron (ZOFRAN) tablet 4 mg (has no administration in time range)    Or  ondansetron (ZOFRAN) injection 4 mg (has no administration in time range)  acetaminophen (TYLENOL) tablet 1,000 mg (has no administration in time range)  doxycycline (VIBRAMYCIN) 100 mg in sodium chloride 0.9 % 250 mL IVPB (100 mg Intravenous New Bag/Given 09/05/21 0639)  metroNIDAZOLE (FLAGYL) IVPB 500 mg (has no administration in time range)  cefTRIAXone (ROCEPHIN) 1 g in sodium chloride 0.9 % 100 mL IVPB (has no administration in time range)  fentaNYL (SUBLIMAZE) injection 100 mcg (has no administration in time range)  oxyCODONE (  Oxy IR/ROXICODONE) immediate release tablet 5 mg (has no administration in time range)  lactated ringers bolus 1,000 mL (0 mLs Intravenous Stopped 09/05/21 0139)  lactated ringers bolus 1,000 mL (0 mLs Intravenous Stopped 09/05/21 0139)    And  lactated ringers bolus 1,000 mL (0 mLs Intravenous Stopped 09/05/21 0139)    And  lactated ringers bolus 500 mL (0 mLs Intravenous Stopped 09/05/21 0349)  ceFEPIme (MAXIPIME) 2 g in sodium chloride 0.9 % 100 mL IVPB (0 g Intravenous Stopped 09/05/21 0139)  metroNIDAZOLE (FLAGYL) IVPB 500 mg (0 mg Intravenous Stopped 09/05/21 0558)  fentaNYL (SUBLIMAZE) injection 50 mcg (50 mcg Intravenous Given 09/05/21 0045)  ondansetron (ZOFRAN) injection 4 mg (4 mg Intravenous Given 09/05/21 0046)    ED Course/ Medical Decision Making/ A&P                           Medical Decision Making Amount and/or Complexity of Data Reviewed Labs: ordered. Radiology: ordered. ECG/medicine tests: ordered.  Risk Prescription drug management. Decision regarding hospitalization.   Patient with history of iron deficiency anemia, recent hysteroscopy and ablation 1 month ago for uterine fibroid here for evaluation of vomiting, diarrhea and chills.  Patient with lower abdominal tenderness on  examination, tachycardia, transient hypotension.  She was started on empiric antibiotics for presumed intra-abdominal infection.  She was started on IV fluid resuscitation for sepsis with hypotension.  Labs significant for acute kidney injury.  Lactic acid is elevated, did clear with IV fluid resuscitation.  Her hypotension did resolve after IV fluid administration.  CT abdomen pelvis was obtained without contrast due to her kidney injury.  CT with possible ovarian stranding.  Pelvic ultrasound was obtained, with no evidence of ovarian torsion or mass.  Pelvic ultrasound is concerning for pelvic infection.  Discussed with Dr. Carlis Abbott with OB/GYN, who saw the patient in consult.  Plan to admit to the OB/GYN service.  Medicine consulted for assistance in medical management of this patient with severe sepsis.  Patient updated findings of studies and recommendation for admission and she is in agreement with treatment plan.        Final Clinical Impression(s) / ED Diagnoses Final diagnoses:  None    Rx / DC Orders ED Discharge Orders     None         Quintella Reichert, MD 09/05/21 [phone]

## 2021-09-05 NOTE — H&P (Signed)
36 y.o. Hannah [name] presents to the Emergency Department with nausea, vomiting, diarrhea and abdominal pain.  She has a history of iron deficiency anemia, uterine fibroids, and heavy menstrual bleeding.  She underwent a Sonata procedure (hysteroscopy with radiofrequency ablation) of her 8 cm anterior fibroid on 08/04/2021 with Dr. Brien Mates.  She felt well the first two weeks post-op, with anticipated mild intermittent cramping and vaginal discharge.  She notes the discharge changed about two weeks ago, becoming more thick and copious.  Over the last two weeks, she started to have low back pain and cramping in the evenings (but felt findn during the daytime hours).  Ibuprofen helped.  She started to just feel more poorly over the past week, with increased pelvic pain, low back pain and decreased appetite.  She developed nausea, vomiting and diarrhea yesterday as well as a fever of 103F at home.    Upon presentation to the ED, she was noted to be septic (hypotensive, tachycardic, elevated lactic acid as well as acute kidney injury with a creatinine of 3.3.).  She was fluid resuscitated, blood cultures drawn and started on empiric cefepime.   She underwent CT abdomen and pelvis which was overall unremarkable.  In particular, there was no evidence of bowel perforation or intra-abdominal fluid collections. The appendix and colon were normal. The left ovary was mildly enlarged with some stranding, so a a pelvic ultrasound was obtained that was also normal.  The fibroid had decreased in size to 5.5 cm.    She denies upper respiratory symptoms, associated sick contacts.  Denies dysuria, urgency, frequency.    She is not sexually active and denies any new sexual partners since her last STI screen.     Past Medical History:  Diagnosis Date   Anemia    No pertinent past medical history     Past Surgical History:  Procedure Laterality Date   CESAREAN SECTION     CESAREAN SECTION  04/08/2012   Procedure: CESAREAN  SECTION;  Surgeon: Daria Pastures, MD;  Location: [facility];  Service: Obstetrics;  Laterality: N/A;  Repeat Cesarean Section Delivery Boy @ [phone],   DILATATION & CURETTAGE/HYSTEROSCOPY WITH MYOSURE N/A 08/04/2021   Procedure: HYSTEROSCOPIC Jacklynn Barnacle MYOMECTOMY;  Surgeon: Rowland Lathe, MD;  Location[facility];  Service: Gynecology;  Laterality: N/A;    OB History  Gravida Para Term Preterm AB Living  3 2 1   1 2   SAB IAB Ectopic Multiple Live Births    1     1    # Outcome Date GA Lbr Len/2nd Weight Sex Delivery Anes PTL Lv  3 Term 04/08/12 [redacted]w[redacted]d   M CS-LTranv Spinal  LIV  2 Para 2008          1 IAB 2006            Social History   Socioeconomic History   Marital status: Divorced    Spouse name: Not on file   Number of children: Not on file   Years of education: Not on file   Highest education level: Not on file  Occupational History   Not on file  Tobacco Use   Smoking status: Never   Smokeless tobacco: Not on file  Vaping Use   Vaping Use: Never used  Substance and Sexual Activity   Alcohol use: No   Drug use: No   Sexual activity: Yes  Other Topics Concern   Not on file  Social History Narrative   Not on file  Social Determinants of Health   Financial Resource Strain: Not on file  Food Insecurity: Not on file  Transportation Needs: Not on file  Physical Activity: Not on file  Stress: Not on file  Social Connections: Not on file  Intimate Partner Violence: Not on file   Prednisone    Vitals:   09/05/21 0515 09/05/21 0530  BP:  99/65  Pulse: 98 95  Resp: (!) 25 (!) 25  Temp:    SpO2: 100% 100%    Chaperone present: RN Otho Perl General:  Tired Lungs: CTAB Cardiac: RRR Abdomen:  Soft, +BS in all quadrants.  No upper abdominal tenderness.  Diffuse tenderness across lower abdomen Pelvic: exam somewhat limited due to lack of available pelvic bed in ED Vulva normal.  Vagina normal.  Cervix with copious thick tan discharge from  the os.  Cervix grossly normal. +CMT.  Uterus midline and mobile, diffusely tender.  Adnexa diffusely tender without masses.  Ex:  no edema   Lab Results  Component Value Date   WBC 16.1 (H) 09/04/2021   HGB 8.1 (L) 09/04/2021   HCT 27.2 (L) 09/04/2021   MCV 62.4 (L) 09/04/2021   PLT 311 09/04/2021   Lab Results  Component Value Date   NA 133 (L) 09/04/2021   K 3.4 (L) 09/04/2021   CO2 17 (L) 09/04/2021   BUN 26 (H) 09/04/2021   CREATININE 3.33 (H) 09/04/2021   CALCIUM 8.2 (L) 09/04/2021   GLUCOSE 140 (H) 09/04/2021    Urinalysis    Component Value Date/Time   COLORURINE AMBER (A) 09/05/2021 0236   APPEARANCEUR CLOUDY (A) 09/05/2021 0236   LABSPEC 1.014 09/05/2021 0236   PHURINE 5.0 09/05/2021 0236   GLUCOSEU 50 (A) 09/05/2021 0236   HGBUR SMALL (A) 09/05/2021 0236   BILIRUBINUR NEGATIVE 09/05/2021 0236   KETONESUR NEGATIVE 09/05/2021 0236   PROTEINUR >=300 (A) 09/05/2021 0236   NITRITE NEGATIVE 09/05/2021 0236   LEUKOCYTESUR NEGATIVE 09/05/2021 0236    Covid-19 Nucleic Acid Test Results Lab Results  Component Value Date   SARSCOV2NAA NEGATIVE 09/04/2021   SARSCOV2NAA RESULT: NEGATIVE 07/31/2021   Microscopic wet-mount exam shows clue cells, white blood cells.   A/P   36 y.o. with abdominal pain, nausea, vomiting and sepsis Sepsis from presume pelvic infection.  Protocol initiated on presentation.  Hypotension and tachycardia have resolved.  Urine and blood cultures pending.  Lactic acid has normalized.  Gonorrhea / chlamydia NAAT collected and pending .  Wet prep with possible bacterial vaginosis. Discussed with patient that infection related to hysteroscopy is quite rare and it is unusual for a post-surgical infection following hysteroscopy to present 4 weeks out.  However, there is no other clear source of infection.  Given diffuse pelvic tenderness and copious discharge, will treat like severe PID to provide broad coverage with rocephin / doxycyline /  metronidazole per CDC guidelines.   Acute kidney injury.  Do not have a baseline creatinine for comparison.  Suspect related to decreased PO intake over the last week compounded by the hypotension/sepsis.  Will continue hydration and trend.  Track strict input/ouput.  Will hold NSAIDs at this time. Replace potassium Medicine consult placed for assistance in management of severe sepsis and acute kidney injury.  Appreciate their input Prophylaxis: SCDs   [place] Please refer to [facility] for the physician on call for [facility] OB/GYN

## 2021-09-05 NOTE — ED Notes (Signed)
This RN assisted OB with pelvic exam.

## 2021-09-06 DIAGNOSIS — N730 Acute parametritis and pelvic cellulitis: Secondary | ICD-10-CM | POA: Diagnosis not present

## 2021-09-06 DIAGNOSIS — A419 Sepsis, unspecified organism: Secondary | ICD-10-CM | POA: Diagnosis not present

## 2021-09-06 DIAGNOSIS — N179 Acute kidney failure, unspecified: Secondary | ICD-10-CM | POA: Diagnosis not present

## 2021-09-06 DIAGNOSIS — E871 Hypo-osmolality and hyponatremia: Secondary | ICD-10-CM

## 2021-09-06 DIAGNOSIS — E872 Acidosis, unspecified: Secondary | ICD-10-CM

## 2021-09-06 LAB — BLOOD CULTURE ID PANEL (REFLEXED) - BCID2
A.calcoaceticus-baumannii: NOT DETECTED
A.calcoaceticus-baumannii: NOT DETECTED
Bacteroides fragilis: NOT DETECTED
Bacteroides fragilis: NOT DETECTED
Candida albicans: NOT DETECTED
Candida albicans: NOT DETECTED
Candida auris: NOT DETECTED
Candida auris: NOT DETECTED
Candida glabrata: NOT DETECTED
Candida glabrata: NOT DETECTED
Candida krusei: NOT DETECTED
Candida krusei: NOT DETECTED
Candida parapsilosis: NOT DETECTED
Candida parapsilosis: NOT DETECTED
Candida tropicalis: NOT DETECTED
Candida tropicalis: NOT DETECTED
Cryptococcus neoformans/gattii: NOT DETECTED
Cryptococcus neoformans/gattii: NOT DETECTED
Enterobacter cloacae complex: NOT DETECTED
Enterobacter cloacae complex: NOT DETECTED
Enterobacterales: NOT DETECTED
Enterobacterales: NOT DETECTED
Enterococcus Faecium: NOT DETECTED
Enterococcus Faecium: NOT DETECTED
Enterococcus faecalis: NOT DETECTED
Enterococcus faecalis: NOT DETECTED
Escherichia coli: NOT DETECTED
Escherichia coli: NOT DETECTED
Haemophilus influenzae: NOT DETECTED
Haemophilus influenzae: NOT DETECTED
Klebsiella aerogenes: NOT DETECTED
Klebsiella aerogenes: NOT DETECTED
Klebsiella oxytoca: NOT DETECTED
Klebsiella oxytoca: NOT DETECTED
Klebsiella pneumoniae: NOT DETECTED
Klebsiella pneumoniae: NOT DETECTED
Listeria monocytogenes: NOT DETECTED
Listeria monocytogenes: NOT DETECTED
Neisseria meningitidis: NOT DETECTED
Neisseria meningitidis: NOT DETECTED
Proteus species: NOT DETECTED
Proteus species: NOT DETECTED
Pseudomonas aeruginosa: NOT DETECTED
Pseudomonas aeruginosa: NOT DETECTED
Salmonella species: NOT DETECTED
Salmonella species: NOT DETECTED
Serratia marcescens: NOT DETECTED
Serratia marcescens: NOT DETECTED
Staphylococcus aureus (BCID): NOT DETECTED
Staphylococcus aureus (BCID): NOT DETECTED
Staphylococcus epidermidis: NOT DETECTED
Staphylococcus epidermidis: NOT DETECTED
Staphylococcus lugdunensis: NOT DETECTED
Staphylococcus lugdunensis: NOT DETECTED
Staphylococcus species: DETECTED — AB
Staphylococcus species: NOT DETECTED
Stenotrophomonas maltophilia: NOT DETECTED
Stenotrophomonas maltophilia: NOT DETECTED
Streptococcus agalactiae: NOT DETECTED
Streptococcus agalactiae: NOT DETECTED
Streptococcus pneumoniae: NOT DETECTED
Streptococcus pneumoniae: NOT DETECTED
Streptococcus pyogenes: NOT DETECTED
Streptococcus pyogenes: NOT DETECTED
Streptococcus species: NOT DETECTED
Streptococcus species: NOT DETECTED

## 2021-09-06 LAB — COMPREHENSIVE METABOLIC PANEL
ALT: 14 U/L (ref 0–44)
AST: 21 U/L (ref 15–41)
Albumin: 1.8 g/dL — ABNORMAL LOW (ref 3.5–5.0)
Alkaline Phosphatase: 107 U/L (ref 38–126)
Anion gap: 12 (ref 5–15)
BUN: 30 mg/dL — ABNORMAL HIGH (ref 6–20)
CO2: 17 mmol/L — ABNORMAL LOW (ref 22–32)
Calcium: 7.4 mg/dL — ABNORMAL LOW (ref 8.9–10.3)
Chloride: 105 mmol/L (ref 98–111)
Creatinine, Ser: 2.29 mg/dL — ABNORMAL HIGH (ref 0.44–1.00)
GFR, Estimated: 28 mL/min — ABNORMAL LOW (ref >60)
Glucose, Bld: 120 mg/dL — ABNORMAL HIGH (ref 70–99)
Potassium: 3.7 mmol/L (ref 3.5–5.1)
Sodium: 134 mmol/L — ABNORMAL LOW (ref 135–145)
Total Bilirubin: 2 mg/dL — ABNORMAL HIGH (ref 0.3–1.2)
Total Protein: 5.7 g/dL — ABNORMAL LOW (ref 6.5–8.1)

## 2021-09-06 LAB — URINE CULTURE

## 2021-09-06 LAB — CBC WITH DIFFERENTIAL/PLATELET
Abs Immature Granulocytes: 0.09 10*3/uL — ABNORMAL HIGH (ref 0.00–0.07)
Basophils Absolute: 0 10*3/uL (ref 0.0–0.1)
Basophils Relative: 0 %
Eosinophils Absolute: 0 10*3/uL (ref 0.0–0.5)
Eosinophils Relative: 0 %
HCT: 24.8 % — ABNORMAL LOW (ref 36.0–46.0)
Hemoglobin: 7.6 g/dL — ABNORMAL LOW (ref 12.0–15.0)
Immature Granulocytes: 1 %
Lymphocytes Relative: 3 %
Lymphs Abs: 0.5 10*3/uL — ABNORMAL LOW (ref 0.7–4.0)
MCH: 20 pg — ABNORMAL LOW (ref 26.0–34.0)
MCHC: 30.6 g/dL (ref 30.0–36.0)
MCV: 65.3 fL — ABNORMAL LOW (ref 80.0–100.0)
Monocytes Absolute: 0.6 10*3/uL (ref 0.1–1.0)
Monocytes Relative: 4 %
Neutro Abs: 13.6 10*3/uL — ABNORMAL HIGH (ref 1.7–7.7)
Neutrophils Relative %: 92 %
Platelets: 99 10*3/uL — ABNORMAL LOW (ref 150–400)
RBC: 3.8 MIL/uL — ABNORMAL LOW (ref 3.87–5.11)
RDW: 21.6 % — ABNORMAL HIGH (ref 11.5–15.5)
WBC: 14.8 10*3/uL — ABNORMAL HIGH (ref 4.0–10.5)
nRBC: 0 % (ref 0.0–0.2)

## 2021-09-06 LAB — MAGNESIUM: Magnesium: 2.6 mg/dL — ABNORMAL HIGH (ref 1.7–2.4)

## 2021-09-06 LAB — GLUCOSE, CAPILLARY
Glucose-Capillary: 100 mg/dL — ABNORMAL HIGH (ref 70–99)
Glucose-Capillary: 100 mg/dL — ABNORMAL HIGH (ref 70–99)
Glucose-Capillary: 105 mg/dL — ABNORMAL HIGH (ref 70–99)
Glucose-Capillary: 121 mg/dL — ABNORMAL HIGH (ref 70–99)
Glucose-Capillary: 89 mg/dL (ref 70–99)
Glucose-Capillary: 99 mg/dL (ref 70–99)

## 2021-09-06 LAB — PHOSPHORUS: Phosphorus: 3.7 mg/dL (ref 2.5–4.6)

## 2021-09-06 LAB — HIV ANTIBODY (ROUTINE TESTING W REFLEX): HIV Screen 4th Generation wRfx: NONREACTIVE

## 2021-09-06 MED ORDER — SODIUM CHLORIDE 0.9 % IV SOLN
100.0000 mg | Freq: Two times a day (BID) | INTRAVENOUS | Status: DC
  Administered 2021-09-06 – 2021-09-08 (×4): 100 mg via INTRAVENOUS
  Filled 2021-09-06 (×5): qty 100

## 2021-09-06 MED ORDER — SODIUM CHLORIDE 0.9 % IV SOLN
2.0000 g | INTRAVENOUS | Status: DC
  Administered 2021-09-06 – 2021-09-07 (×2): 2 g via INTRAVENOUS
  Filled 2021-09-06 (×2): qty 20

## 2021-09-06 MED ORDER — LACTATED RINGERS IV BOLUS
1000.0000 mL | Freq: Once | INTRAVENOUS | Status: AC
  Administered 2021-09-06: 1000 mL via INTRAVENOUS

## 2021-09-06 MED ORDER — LACTATED RINGERS IV SOLN: INTRAVENOUS | Status: AC

## 2021-09-06 NOTE — Progress Notes (Signed)
Addendum: Now growing GNR (no speciation on BCID) in 1/2 bottles   PHARMACY - [place] (BCID)  Hannah [name] is an 36 y.o. female who presented to [facility] on 09/04/2021 with a chief complaint of abdominal/back pain s/p Sonata procedure for uterine fibroids on 1/16. Now with new onset hypotension  Assessment:   Blood cultures + for staph spp (no resistance) in 1/4 bottles (aerobic)  Name of physician (or Provider) Contacted: Dr. Carlis Abbott  Current antibiotics: Ceftriaxone, metronidazole, doxycycline  Changes to prescribed antibiotics recommended:  Increase ceftriaxone from 1gm to 2gm q24hr No further changes at this time.  Results for orders placed or performed during the hospital encounter of 09/04/21  Blood Culture ID Panel (Reflexed) (Collected: 09/04/2021 11:11 PM)  Result Value Ref Range   Enterococcus faecalis NOT DETECTED NOT DETECTED   Enterococcus Faecium NOT DETECTED NOT DETECTED   Listeria monocytogenes NOT DETECTED NOT DETECTED   Staphylococcus species DETECTED (A) NOT DETECTED   Staphylococcus aureus (BCID) NOT DETECTED NOT DETECTED   Staphylococcus epidermidis NOT DETECTED NOT DETECTED   Staphylococcus lugdunensis NOT DETECTED NOT DETECTED   Streptococcus species NOT DETECTED NOT DETECTED   Streptococcus agalactiae NOT DETECTED NOT DETECTED   Streptococcus pneumoniae NOT DETECTED NOT DETECTED   Streptococcus pyogenes NOT DETECTED NOT DETECTED   A.calcoaceticus-baumannii NOT DETECTED NOT DETECTED   Bacteroides fragilis NOT DETECTED NOT DETECTED   Enterobacterales NOT DETECTED NOT DETECTED   Enterobacter cloacae complex NOT DETECTED NOT DETECTED   Escherichia coli NOT DETECTED NOT DETECTED   Klebsiella aerogenes NOT DETECTED NOT DETECTED   Klebsiella oxytoca NOT DETECTED NOT DETECTED   Klebsiella pneumoniae NOT DETECTED NOT DETECTED   Proteus species NOT DETECTED NOT DETECTED   Salmonella species  NOT DETECTED NOT DETECTED   Serratia marcescens NOT DETECTED NOT DETECTED   Haemophilus influenzae NOT DETECTED NOT DETECTED   Neisseria meningitidis NOT DETECTED NOT DETECTED   Pseudomonas aeruginosa NOT DETECTED NOT DETECTED   Stenotrophomonas maltophilia NOT DETECTED NOT DETECTED   Candida albicans NOT DETECTED NOT DETECTED   Candida auris NOT DETECTED NOT DETECTED   Candida glabrata NOT DETECTED NOT DETECTED   Candida krusei NOT DETECTED NOT DETECTED   Candida parapsilosis NOT DETECTED NOT DETECTED   Candida tropicalis NOT DETECTED NOT DETECTED   Cryptococcus neoformans/gattii NOT DETECTED NOT DETECTED   Thank you for allowing pharmacy to be a part of this patients care.  Donnald Garre, PharmD Clinical Pharmacist  Please check AMION for all [place] numbers After 10:00 PM, call [place] [phone]

## 2021-09-06 NOTE — Progress Notes (Signed)
S: Patient sitting up in bed, mother at bedside. States she feels tired but just received pain medication. Endorsing some vaginal bleeding (spotting) mixed in with clear yellow discharge but no actual flow akin to menses. States pain is mainly in lower back. N/V has improved from yesterday, no diarrhea; feels dehydrated and is tolerating PO fluids. Reviewed improved VS and being able to come off pressors as improved signs.   O: BP 116/77    Pulse (!) 119    Temp 98.6 F (37 C) (Oral)    Resp (!) 33    Ht 5\' 1"  (1.549 m)    Wt 86 kg    SpO2 94%    BMI 35.82 kg/m  Gen: Tired appear AAF in NAD, sitting up in bed CV: On room air, CTAB Abd: Soft, mild tympany epigastrically, overall NTTP GU: No blood on pad this AM, thin yellow discharge present, no foul odor MSK: Trace edema BL, neg clubbing/cyanosis Neuro: AAOx3  Labs: Blood cultures now w/ GNR in 1/2 bottles, +GPC in 1/4 (staph) WBC 14.8, 7.6/24.8/99, Cr 2.29  A/P: POD#33 after hysteroscopic Sonata procedure for treatment of uterine fibroids, admitted for septic shock 1.Septic Shock ? due to PID remote from surgery: improving - admitted to ICU, appreciate CCM care  - IV antibiotics/IVF, pressors have been Dc'ed - Spoke with attending, will need to confirm that [facility] is not contaminant prior to consulting for TEE   2. AKI: avoid nephrotoxins - Cr slowly improving from admission, now 2.29 - improving PO tolerance at this time   3. Anemia: improved to 7.6 s/p 1u prbc                    - no evidence of acute abdomen, minimal vaginal bleeding                    - consider repeat imaging if worsening anemia   4. Thrombocytopenia: 2/2 sepsis vs abx   - plan to trend CBC  Very much appreciate CCM help with care. Once stable from their perscpective, may transger patient to GYN service

## 2021-09-06 NOTE — Progress Notes (Addendum)
NAME:  Hannah [name], MRN:  [id number], DOB:  March 12, 1986, LOS: 1 ADMISSION DATE:  09/04/2021, CONSULTATION DATE:  09/05/2021 REFERRING MD:  Dr. Candiss Norse, CHIEF COMPLAINT:  Hypotension   History of Present Illness:  36 year old female with history as below who presented 2/17 to [facility] ER after feeling unwell on 2/11 with 2 week history of lower abdominal and back with one day history of subjective fever 103F, chills, nausea, vomiting, and diarrhea.  Reported light vaginal discharge present since undergoing a Sonata procedure (hysteroscopy with radiofrequency ablation) for uterine fibroids on 1/16 but in the last two weeks has become more thick and copious.  In ER, she was febrile, hypotensive, tachycardic and noted to have AKI and elevated lactic acid.  She was given IV fluids, cultures sent, and started on empiric cefepime.  CT abd/ pelvis did not show anything acute, no hydronephrosis or nephrolithiasis but noted enlarged appearance of left ovary with surrounding stranding, recommending ultrasound with duplex to exclude ovarian torsion.  She underwent US duplex which did not show evidence of ovarian torsion, noted uterine body fibroid with reduced size from prior.  Wet prep showing possible bacterial vaginosis.  Negative for chlamydia/ gonorrhea.  UA/ CXR neg.  OB/ GYN to admit patient for sepsis secondary to presumed PID as no other obvious source.  Abx expanded to ceftriaxone, doxycycline, and metronidazole.  Repeat labs showing clearance of lactic with sCr improving 3.33> 2.47 however Hgb noted to drop 8.1> 6.6 (she was 9.8 on 08/04/21).  She had acute worsening of symptoms this morning, becoming tachycardic into 170s, febrile, worsening abdominal pain, and increased work of breathing.  Given Hgb drop and concern for ovarian torsion, patient was emergently transferred to [facility] ER for OB/ GYN evaluation.   TRH consulted to help with further medical management.  History corroborated by mom at the  bedside  Pertinent  Medical History  IDA, heavy menstrual bleeding, uterine fibroids (GYN- Dr. Brien Mates with [facility])  [facility] Events: Including procedures, antibiotic start and stop dates in addition to other pertinent events   2/17 Admit with septic shock thought GYN source, hypovolemia.  CT ABD/Pelvis with no hydronephrosis, enlarged appearance of the L ovary with surrounding stranding. No bowel obstruction. Normal appendix.    Interim History / Subjective:  Pt reports feeling better since yesterday but not back to baseline.  States she feels thirsty / dry.  Remains on neosynephrine, low dose  Tolerating PO's  Off oxygen  No urine recorded but pt reports urinating in [facility] multiple times  Objective   Blood pressure 109/73, pulse 99, temperature 97.9 F (36.6 C), resp. rate 19, height 5' 1"  (1.549 m), weight 86 kg, SpO2 97 %.        Intake/Output Summary (Last 24 hours) at 09/06/2021 0813 Last data filed at 09/06/2021 0700 Gross per 24 hour  Intake 1478.86 ml  Output --  Net 1478.86 ml   Filed Weights   09/04/21 2247 09/05/21 1751 09/06/21 0500  Weight: 81.6 kg 85.7 kg 86 kg    Examination: General: adult female lying in bed in NAD HEENT: MM pink/moist, anicteric, good dentition, trace marking on face from prior oxygen tubing  Neuro: AAOx4, speech clear, MAE  CV: s1s2 RRR, ST on monitor, no m/r/g PULM: non-labored at rest, lungs bilaterally clear  GI: soft, bsx4 active  Extremities: warm/dry, no edema  Skin: no rashes or lesions  Resolved Hospital Problem list    Assessment & Plan:   Mixed Hypovolemic +  Septic Shock  N/V/D prior to admit, likely source pelvic inflammatory disease although atypical presentation few weeks after hysteroscopy procedure.  No evidence for ovarian torsion on ultrasound pelvis with doppler.  Fluid resuscitated but remained hypotensive.  -continue rocephin, doxycycline, metronidazole  -wean neosynephrine off for MAP  >65  -follow blood cultures > GNR in 2/2 bottles from 2/17, 2/16 culture with GPC's -pending cultures, may need TEE -appreciate OB/GYN assistance with patient care   AKI  Metabolic Acidosis In setting of sepsis, hypotension -Trend BMP / urinary output -Replace electrolytes as indicated -Avoid nephrotoxic agents, ensure adequate renal perfusion -encouraged PO intake   Microcytic Anemia of chronic blood loss due to menorrhagia + hemodilution S/p 1 unit 2/17. No evidence of acute bleeding on CT/US imaging  -trend CBC  -monitor for bleeding   Thrombocytopenia  Suspect reactive in setting of sepsis  -follow trend   Hypomagnesemia  Hypokalemia Hyponatremia -follow electrolytes, replace as indicated   Hyperbilirubinemia, elevated alk phos No abnormalities noted to CT abd/pelvis.  Improved on repeat labs.   -follow trend   Best Practice (right click and "Reselect all SmartList Selections" daily)  Diet/type: Regular consistency (see orders) and clear liquids DVT prophylaxis: prophylactic heparin  GI prophylaxis: N/A Lines: N/A Foley:  N/A Code Status:  full code Last date of multidisciplinary goals of care discussion: Patient updated on plan of care 2/18  Critical care time: 60 minutes     Noe Gens, MSN, APRN, NP-C, AGACNP-BC [facility] 09/06/2021, 8:14 AM   Please see [email or web address] for pager details.   From 7A-7P if no response, please call [phone] After hours, please call [facility]Link [phone]

## 2021-09-06 NOTE — Progress Notes (Signed)
PHARMACY - PHYSICIAN COMMUNICATION CRITICAL VALUE ALERT - BLOOD CULTURE IDENTIFICATION (BCID)  Hannah [name] is an 36 y.o. female who presented to [facility] on 09/04/2021 with a chief complaint of abdominal/back pain s/p Sonata procedure for uterine fibroids on 1/16. Now with new onset hypotension  Assessment:   Blood cultures + for staph spp (no resistance) in 1/4 bottles (aerobic)  Name of physician (or Provider) Contacted: Dr. Carlis Abbott  Current antibiotics: Ceftriaxone, metronidazole, doxycycline  Changes to prescribed antibiotics recommended:  Increase ceftriaxone from 1gm to 2gm q24hr No further changes at this time.  Results for orders placed or performed during the hospital encounter of 09/04/21  Blood Culture ID Panel (Reflexed) (Collected: 09/04/2021 11:11 PM)  Result Value Ref Range   Enterococcus faecalis NOT DETECTED NOT DETECTED   Enterococcus Faecium NOT DETECTED NOT DETECTED   Listeria monocytogenes NOT DETECTED NOT DETECTED   Staphylococcus species DETECTED (A) NOT DETECTED   Staphylococcus aureus (BCID) NOT DETECTED NOT DETECTED   Staphylococcus epidermidis NOT DETECTED NOT DETECTED   Staphylococcus lugdunensis NOT DETECTED NOT DETECTED   Streptococcus species NOT DETECTED NOT DETECTED   Streptococcus agalactiae NOT DETECTED NOT DETECTED   Streptococcus pneumoniae NOT DETECTED NOT DETECTED   Streptococcus pyogenes NOT DETECTED NOT DETECTED   A.calcoaceticus-baumannii NOT DETECTED NOT DETECTED   Bacteroides fragilis NOT DETECTED NOT DETECTED   Enterobacterales NOT DETECTED NOT DETECTED   Enterobacter cloacae complex NOT DETECTED NOT DETECTED   Escherichia coli NOT DETECTED NOT DETECTED   Klebsiella aerogenes NOT DETECTED NOT DETECTED   Klebsiella oxytoca NOT DETECTED NOT DETECTED   Klebsiella pneumoniae NOT DETECTED NOT DETECTED   Proteus species NOT DETECTED NOT DETECTED   Salmonella species NOT DETECTED NOT DETECTED   Serratia marcescens NOT DETECTED NOT  DETECTED   Haemophilus influenzae NOT DETECTED NOT DETECTED   Neisseria meningitidis NOT DETECTED NOT DETECTED   Pseudomonas aeruginosa NOT DETECTED NOT DETECTED   Stenotrophomonas maltophilia NOT DETECTED NOT DETECTED   Candida albicans NOT DETECTED NOT DETECTED   Candida auris NOT DETECTED NOT DETECTED   Candida glabrata NOT DETECTED NOT DETECTED   Candida krusei NOT DETECTED NOT DETECTED   Candida parapsilosis NOT DETECTED NOT DETECTED   Candida tropicalis NOT DETECTED NOT DETECTED   Cryptococcus neoformans/gattii NOT DETECTED NOT DETECTED   Thank you for allowing pharmacy to be a part of this patients care.  Donnald Garre, PharmD Clinical Pharmacist  Please check AMION for all [place] numbers After 10:00 PM, call [place] [phone]

## 2021-09-06 NOTE — Progress Notes (Signed)
°  Transition of Care [facility]) Screening Note   Patient Details  Name: Hannah [name] Date of Birth: 1986-02-12   Transition of Care [facility]) CM/SW Contact:    Benard Halsted, LCSW Phone Number: 09/06/2021, 8:28 AM    Transition of Care Department [facility] has reviewed patient and no TOC needs have been identified at this time. We will continue to monitor patient advancement through interdisciplinary progression rounds. If new patient transition needs arise, please place a TOC consult.

## 2021-09-07 DIAGNOSIS — N179 Acute kidney failure, unspecified: Secondary | ICD-10-CM | POA: Diagnosis not present

## 2021-09-07 DIAGNOSIS — R7881 Bacteremia: Secondary | ICD-10-CM

## 2021-09-07 DIAGNOSIS — N730 Acute parametritis and pelvic cellulitis: Secondary | ICD-10-CM | POA: Diagnosis not present

## 2021-09-07 LAB — COMPREHENSIVE METABOLIC PANEL
ALT: 11 U/L (ref 0–44)
AST: 15 U/L (ref 15–41)
Albumin: 1.7 g/dL — ABNORMAL LOW (ref 3.5–5.0)
Alkaline Phosphatase: 75 U/L (ref 38–126)
Anion gap: 10 (ref 5–15)
BUN: 27 mg/dL — ABNORMAL HIGH (ref 6–20)
CO2: 17 mmol/L — ABNORMAL LOW (ref 22–32)
Calcium: 7.3 mg/dL — ABNORMAL LOW (ref 8.9–10.3)
Chloride: 106 mmol/L (ref 98–111)
Creatinine, Ser: 1.14 mg/dL — ABNORMAL HIGH (ref 0.44–1.00)
GFR, Estimated: >60 mL/min (ref >60)
Glucose, Bld: 88 mg/dL (ref 70–99)
Potassium: 3.6 mmol/L (ref 3.5–5.1)
Sodium: 133 mmol/L — ABNORMAL LOW (ref 135–145)
Total Bilirubin: 0.9 mg/dL (ref 0.3–1.2)
Total Protein: 5.3 g/dL — ABNORMAL LOW (ref 6.5–8.1)

## 2021-09-07 LAB — CBC
HCT: 24.3 % — ABNORMAL LOW (ref 36.0–46.0)
Hemoglobin: 7.6 g/dL — ABNORMAL LOW (ref 12.0–15.0)
MCH: 19.6 pg — ABNORMAL LOW (ref 26.0–34.0)
MCHC: 31.3 g/dL (ref 30.0–36.0)
MCV: 62.6 fL — ABNORMAL LOW (ref 80.0–100.0)
Platelets: UNDETERMINED 10*3/uL (ref 150–400)
RBC: 3.88 MIL/uL (ref 3.87–5.11)
RDW: 21.7 % — ABNORMAL HIGH (ref 11.5–15.5)
WBC: 11.9 10*3/uL — ABNORMAL HIGH (ref 4.0–10.5)
nRBC: 0 % (ref 0.0–0.2)

## 2021-09-07 LAB — GLUCOSE, CAPILLARY
Glucose-Capillary: 82 mg/dL (ref 70–99)
Glucose-Capillary: 87 mg/dL (ref 70–99)
Glucose-Capillary: 99 mg/dL (ref 70–99)
Glucose-Capillary: 94 mg/dL (ref 70–99)

## 2021-09-07 MED ORDER — SENNA 8.6 MG PO TABS
1.0000 | ORAL_TABLET | Freq: Every day | ORAL | Status: DC
  Administered 2021-09-08 – 2021-09-09 (×2): 8.6 mg via ORAL
  Filled 2021-09-07 (×3): qty 1

## 2021-09-07 MED ORDER — POTASSIUM CHLORIDE CRYS ER 20 MEQ PO TBCR
40.0000 meq | EXTENDED_RELEASE_TABLET | Freq: Once | ORAL | Status: AC
  Administered 2021-09-07: 40 meq via ORAL
  Filled 2021-09-07: qty 2

## 2021-09-07 MED ORDER — POLYETHYLENE GLYCOL 3350 17 G PO PACK
17.0000 g | PACK | Freq: Every day | ORAL | Status: DC
  Administered 2021-09-08 – 2021-09-09 (×2): 17 g via ORAL
  Filled 2021-09-07 (×3): qty 1

## 2021-09-07 NOTE — Progress Notes (Signed)
Transferred pt to [phone] with Cellphone and 2 bags of belongings.  Informed Mom of transfer.  Irven Baltimore, RN

## 2021-09-07 NOTE — Progress Notes (Signed)
NAME:  Hannah [name], MRN:  [id number], DOB:  28-Feb-1986, LOS: 2 ADMISSION DATE:  09/04/2021, CONSULTATION DATE:  09/05/2021 REFERRING MD:  Dr. Candiss Norse, CHIEF COMPLAINT:  Hypotension   History of Present Illness:  36 year old female with history as below who presented 2/17 to [facility] ER after feeling unwell on 2/11 with 2 week history of lower abdominal and back with one day history of subjective fever 103F, chills, nausea, vomiting, and diarrhea.  Reported light vaginal discharge present since undergoing a Sonata procedure (hysteroscopy with radiofrequency ablation) for uterine fibroids on 1/16 but in the last two weeks has become more thick and copious.  In ER, she was febrile, hypotensive, tachycardic and noted to have AKI and elevated lactic acid.  She was given IV fluids, cultures sent, and started on empiric cefepime.  CT abd/ pelvis did not show anything acute, no hydronephrosis or nephrolithiasis but noted enlarged appearance of left ovary with surrounding stranding, recommending ultrasound with duplex to exclude ovarian torsion.  She underwent US duplex which did not show evidence of ovarian torsion, noted uterine body fibroid with reduced size from prior.  Wet prep showing possible bacterial vaginosis.  Negative for chlamydia/ gonorrhea.  UA/ CXR neg.  OB/ GYN to admit patient for sepsis secondary to presumed PID as no other obvious source.  Abx expanded to ceftriaxone, doxycycline, and metronidazole.  Repeat labs showing clearance of lactic with sCr improving 3.33> 2.47 however Hgb noted to drop 8.1> 6.6 (she was 9.8 on 08/04/21).  She had acute worsening of symptoms this morning, becoming tachycardic into 170s, febrile, worsening abdominal pain, and increased work of breathing.  Given Hgb drop and concern for ovarian torsion, patient was emergently transferred to [facility] ER for OB/ GYN evaluation.   TRH consulted to help with further medical management.  History corroborated by mom at the  bedside  Pertinent  Medical History  IDA, heavy menstrual bleeding, uterine fibroids (GYN- Dr. Brien Mates with [facility])  [facility] Events: Including procedures, antibiotic start and stop dates in addition to other pertinent events   2/17 Admit with septic shock thought GYN source, hypovolemia.  CT ABD/Pelvis with no hydronephrosis, enlarged appearance of the L ovary with surrounding stranding. No bowel obstruction. Normal appendix.    Interim History / Subjective:  She continues to have 7/10 abd pain, but no nausea or vomiting. Appetite is slow to return.  Objective   Blood pressure 123/72, pulse (!) 116, temperature 99 F (37.2 C), temperature source Oral, resp. rate (!) 29, height 5' 1"  (1.549 m), weight 86 kg, SpO2 92 %.        Intake/Output Summary (Last 24 hours) at 09/07/2021 1028 Last data filed at 09/07/2021 0400 Gross per 24 hour  Intake 2068.87 ml  Output --  Net 2068.87 ml    Filed Weights   09/05/21 1751 09/06/21 0500 09/07/21 0500  Weight: 85.7 kg 86 kg 86 kg    Examination: General:  young woman sitting up in bed in NAD HEENT: [place]/AT, eyes anicteric Neuro: awake, alert, moving all extremities, normal speech, answering questions appropriately  CV: s1S2, RRR PULM: breathing comfortably on RA, CTAB, reduced basilar breath sounds. No tachypnea or accessory muscle use. GI: soft, NT Extremities: no cyanosis, minimal pedal edema Skin: warm, dry, no diffuse rashes  Bicarb 17 BUN  27 Cr 1.14 WBC 11.9 H/H 7.6/24.3 Blood cultures> GNR 2/4-- different sites, 1/4 GPC. Biofire only positive for staph species.   Resolved Hospital Problem list  Assessment & Plan:   Mixed Hypovolemic + Septic Shock; shock resolved. GNR bacteremia and PID. GPC is likely contaminant. N/V/D prior to admit, likely source pelvic inflammatory disease although atypical presentation few weeks after hysteroscopy procedure.  No evidence for ovarian torsion on ultrasound  pelvis with doppler.  Fluid resuscitated but remained hypotensive.  -con't broad antibiotics-- can stop doxy with negative chlamydia -remains off pressors -follow blood cultures until finalized. If GPC stays 1/4 bottles and coag negative staph, likely contaminant and no need for follow up cultures or further evaluation -appreciate Gyn's management  AKI , improving Metabolic Acidosis -renally dose meds, avoid nephrotoxic meds -strict I/Os -monitor -ad lib enteral intake  Microcytic Anemia of chronic blood loss due to menorrhagia + hemodilution S/p 1 unit 2/17. No evidence of acute bleeding on CT/US imaging  -transfuse for Hb<7 or hemodynamically significant bleeding -monitor CBCs daily -monitor clinically for bleeding  Thrombocytopenia, acute-- due to sepsis and antibiotics -no indication for transfusion unless <10 or bleeding and <50 -monitor  Hypomagnesemia  Hypokalemia, resolved Hyponatremia -recheck electrolytes tomorrow, replete as needed  Hyperbilirubinemia, elevated alk phos, resolved No abnormalities noted to CT abd/pelvis.  Improved on repeat labs.   -no additional monitoring needed  Stable to transfer out of ICU-- [facility]. D/w Dr. Wilhelmenia Blase. PCCM will sign off.   Best Practice (right click and "Reselect all SmartList Selections" daily)  Diet/type: Regular consistency (see orders) and clear liquids DVT prophylaxis: prophylactic heparin  GI prophylaxis: N/A Lines: N/A Foley:  N/A Code Status:  full code Last date of multidisciplinary goals of care discussion: Patient and mother updated on plan of care 2/19       Julian Hy, DO 09/07/21 2:58 PM [facility]

## 2021-09-07 NOTE — Progress Notes (Addendum)
S: Patient sitting up in bed about to eat lunch. Feels more alert than yesterday, mother corroborates this as well. Still endorsing same pain in lower abdomen/back and also notes some increase in vaginal bleeding. However, patient is due for her menses this month (normally comes on 1st of month, likely delayed 2/2 surgery and recent illness). Passing flatus but feels gassy, no BM since yesterday. Only ambulates to commode, denies significant dizziness with this but does endorse fatigue. Tolerating PO without N/V/D.  Reviewed improvement in vital signs as well as being afebrile. Spoke with ICU attending who agrees that care may be transferred to GYN service  O: BP (!) 130/92    Pulse (!) 113    Temp 99.8 F (37.7 C) (Oral)    Resp (!) 25    Ht 5\' 1"  (1.549 m)    Wt 86 kg    SpO2 92%    BMI 35.82 kg/m  Gen: Less tired-appearing AAF in NAD, sitting up in bed CV: On room air, CTAB Abd: Soft, mild tympany epigastrically, overall NTTP GU: Scant blood on pad this AM, thin yellow discharge present, no foul odor MSK: Trace edema BL, neg clubbing/cyanosis Neuro: AAOx3  Labs: Blood cultures now w/ GNR in 1/2 bottles, +GPC in 1/4 (staph) WBC 14.8>11.9, 7.6/24.8/99>7.6/24.3/clumps, Cr 1.14  A/P: POD#34 after hysteroscopic Sonata procedure for treatment of uterine fibroids, admitted for septic shock 1.Septic Shock ? due to PID remote from surgery: improving - From ICU attending, stable for discharge from PCCM - IV antibiotics/IVF on board, plan to DC doxycycline - Spoke with attending, will need to confirm that [facility] is not contaminant prior to consulting for TEE, can follow these cultures up while on OB specialty care   2. AKI: avoid nephrotoxins - Cr slowly improving from admission, now 1.14 - improving PO tolerance at this time   3. Anemia: improved to 7.6 s/p 1u prbc                    - no evidence of acute abdomen, minimal vaginal bleeding                    - consider repeat imaging if worsening  anemia   4. Thrombocytopenia: 2/2 sepsis vs abx   - plan to trend CBC  Very much appreciate CCM help with care. Plan to transition to 6N under GYN care. Will continue IV antibiotics there, trend CBC/CMP/lytes, taper down IV fluids and initiation PT consult to improve upon ambulation

## 2021-09-08 LAB — CULTURE, BLOOD (ROUTINE X 2): Special Requests: ADEQUATE

## 2021-09-08 LAB — CBC WITH DIFFERENTIAL/PLATELET
Abs Immature Granulocytes: 0.11 10*3/uL — ABNORMAL HIGH (ref 0.00–0.07)
Basophils Absolute: 0 10*3/uL (ref 0.0–0.1)
Basophils Relative: 0 %
Eosinophils Absolute: 0.1 10*3/uL (ref 0.0–0.5)
Eosinophils Relative: 1 %
HCT: 24.4 % — ABNORMAL LOW (ref 36.0–46.0)
Hemoglobin: 7.4 g/dL — ABNORMAL LOW (ref 12.0–15.0)
Immature Granulocytes: 1 %
Lymphocytes Relative: 8 %
Lymphs Abs: 0.8 10*3/uL (ref 0.7–4.0)
MCH: 19.1 pg — ABNORMAL LOW (ref 26.0–34.0)
MCHC: 30.3 g/dL (ref 30.0–36.0)
MCV: 62.9 fL — ABNORMAL LOW (ref 80.0–100.0)
Monocytes Absolute: 0.5 10*3/uL (ref 0.1–1.0)
Monocytes Relative: 5 %
Neutro Abs: 9 10*3/uL — ABNORMAL HIGH (ref 1.7–7.7)
Neutrophils Relative %: 85 %
Platelets: 60 10*3/uL — ABNORMAL LOW (ref 150–400)
RBC: 3.88 MIL/uL (ref 3.87–5.11)
RDW: 22.1 % — ABNORMAL HIGH (ref 11.5–15.5)
Smear Review: DECREASED
WBC: 10.5 10*3/uL (ref 4.0–10.5)
nRBC: 0 % (ref 0.0–0.2)

## 2021-09-08 LAB — COMPREHENSIVE METABOLIC PANEL
ALT: 10 U/L (ref 0–44)
AST: 13 U/L — ABNORMAL LOW (ref 15–41)
Albumin: 1.6 g/dL — ABNORMAL LOW (ref 3.5–5.0)
Alkaline Phosphatase: 73 U/L (ref 38–126)
Anion gap: 7 (ref 5–15)
BUN: 17 mg/dL (ref 6–20)
CO2: 20 mmol/L — ABNORMAL LOW (ref 22–32)
Calcium: 7.6 mg/dL — ABNORMAL LOW (ref 8.9–10.3)
Chloride: 110 mmol/L (ref 98–111)
Creatinine, Ser: 0.82 mg/dL (ref 0.44–1.00)
GFR, Estimated: >60 mL/min (ref >60)
Glucose, Bld: 95 mg/dL (ref 70–99)
Potassium: 3.6 mmol/L (ref 3.5–5.1)
Sodium: 137 mmol/L (ref 135–145)
Total Bilirubin: 0.4 mg/dL (ref 0.3–1.2)
Total Protein: 5.5 g/dL — ABNORMAL LOW (ref 6.5–8.1)

## 2021-09-08 LAB — MAGNESIUM: Magnesium: 2.1 mg/dL (ref 1.7–2.4)

## 2021-09-08 LAB — PHOSPHORUS: Phosphorus: 2.4 mg/dL — ABNORMAL LOW (ref 2.5–4.6)

## 2021-09-08 MED ORDER — FAMOTIDINE 20 MG PO TABS
20.0000 mg | ORAL_TABLET | Freq: Two times a day (BID) | ORAL | Status: DC
  Administered 2021-09-08 – 2021-09-12 (×8): 20 mg via ORAL
  Filled 2021-09-08 (×8): qty 1

## 2021-09-08 MED ORDER — DOXYCYCLINE HYCLATE 100 MG PO TABS
100.0000 mg | ORAL_TABLET | Freq: Two times a day (BID) | ORAL | Status: DC
  Administered 2021-09-08: 100 mg via ORAL
  Filled 2021-09-08 (×2): qty 1

## 2021-09-08 MED ORDER — METRONIDAZOLE 500 MG PO TABS
500.0000 mg | ORAL_TABLET | Freq: Two times a day (BID) | ORAL | Status: DC
  Administered 2021-09-08: 500 mg via ORAL
  Filled 2021-09-08 (×2): qty 1

## 2021-09-08 MED ORDER — SODIUM CHLORIDE 0.9 % IV SOLN
100.0000 mg | Freq: Two times a day (BID) | INTRAVENOUS | Status: DC
  Filled 2021-09-08 (×2): qty 100

## 2021-09-08 MED ORDER — K PHOS MONO-SOD PHOS DI & MONO 155-852-130 MG PO TABS
250.0000 mg | ORAL_TABLET | Freq: Every day | ORAL | Status: DC
  Administered 2021-09-09 – 2021-09-12 (×4): 250 mg via ORAL
  Filled 2021-09-08 (×6): qty 1

## 2021-09-08 MED ORDER — METRONIDAZOLE 500 MG/100ML IV SOLN
500.0000 mg | Freq: Two times a day (BID) | INTRAVENOUS | Status: DC
  Administered 2021-09-08 – 2021-09-09 (×2): 500 mg via INTRAVENOUS
  Filled 2021-09-08 (×2): qty 100

## 2021-09-08 NOTE — Progress Notes (Addendum)
Patient states that she feels nauseous after she took the Flagyl PO this afternoon. This patient is asking if we can switch back to IVABT and this nurse stated that she will need to be on PO ABT prior her being discharged. This patient and her mother voices concern that they don't think that she is ready to be discharge this early because she still feels weak. Writer spoke to K. Harrington Challenger, MD and MD stated we can try giving Zofran first before giving PO ABT, Also ordered PO Pepcid.  2125 PRN Zofran,pepcid was administered then writer went back to give her POABT ,Patient  took Doxycycline 1st and wanted to wait for an hour before she takes PO Flagyl. She doesn't want to take it at the same time.  2242. Went back to room to give PO Flagyl and right after she took it she vomited.  Mignon Pine, MD made aware and received verbal order to switch patient PO to IVABT( Doxy and Flagyl).   Patient and mother was appreciative. Will continue to monitor patient.

## 2021-09-08 NOTE — Progress Notes (Signed)
Hospital Day # 4 Admission for PID/Sepsis  Subjective: Patient reports pain controlled, having some lower abdominal cramping. Tolerating po. Spotting minimal. + BM. Ambulating without dizziness  Objective: Vitals:   09/07/21 2023 09/08/21 0008 09/08/21 0409 09/08/21 0704  BP: 132/83 129/89 130/90 128/89  Pulse: (!) 110 (!) 110 98 94  Resp: 16 17 19 16   Temp: 99.8 F (37.7 C) 99.7 F (37.6 C) 98.3 F (36.8 C) 98.8 F (37.1 C)  TempSrc: Oral Oral Oral Oral  SpO2: 97% 97% 100% 98%  Weight:      Height:         General: alert, cooperative, and appears stated age Abd soft Small amount of spotting on pad, recently changed  Results for orders placed or performed during the hospital encounter of 09/04/21 (from the past 24 hour(s))  Glucose, capillary     Status: None   Collection Time: 09/07/21  3:32 PM  Result Value Ref Range   Glucose-Capillary 94 70 - 99 mg/dL  CBC with Differential/Platelet     Status: Abnormal   Collection Time: 09/08/21  6:28 AM  Result Value Ref Range   WBC 10.5 4.0 - 10.5 K/uL   RBC 3.88 3.87 - 5.11 MIL/uL   Hemoglobin 7.4 (L) 12.0 - 15.0 g/dL   HCT 24.4 (L) 36.0 - 46.0 %   MCV 62.9 (L) 80.0 - 100.0 fL   MCH 19.1 (L) 26.0 - 34.0 pg   MCHC 30.3 30.0 - 36.0 g/dL   RDW 22.1 (H) 11.5 - 15.5 %   Platelets 60 (L) 150 - 400 K/uL   nRBC 0.0 0.0 - 0.2 %   Neutrophils Relative % 85 %   Neutro Abs 9.0 (H) 1.7 - 7.7 K/uL   Lymphocytes Relative 8 %   Lymphs Abs 0.8 0.7 - 4.0 K/uL   Monocytes Relative 5 %   Monocytes Absolute 0.5 0.1 - 1.0 K/uL   Eosinophils Relative 1 %   Eosinophils Absolute 0.1 0.0 - 0.5 K/uL   Basophils Relative 0 %   Basophils Absolute 0.0 0.0 - 0.1 K/uL   WBC Morphology MORPHOLOGY UNREMARKABLE    Smear Review PLATELETS APPEAR DECREASED    Immature Granulocytes 1 %   Abs Immature Granulocytes 0.11 (H) 0.00 - 0.07 K/uL   Schistocytes PRESENT    Ovalocytes PRESENT   Comprehensive metabolic panel     Status: Abnormal   Collection  Time: 09/08/21  6:28 AM  Result Value Ref Range   Sodium 137 135 - 145 mmol/L   Potassium 3.6 3.5 - 5.1 mmol/L   Chloride 110 98 - 111 mmol/L   CO2 20 (L) 22 - 32 mmol/L   Glucose, Bld 95 70 - 99 mg/dL   BUN 17 6 - 20 mg/dL   Creatinine, Ser 0.82 0.44 - 1.00 mg/dL   Calcium 7.6 (L) 8.9 - 10.3 mg/dL   Total Protein 5.5 (L) 6.5 - 8.1 g/dL   Albumin 1.6 (L) 3.5 - 5.0 g/dL   AST 13 (L) 15 - 41 U/L   ALT 10 0 - 44 U/L   Alkaline Phosphatase 73 38 - 126 U/L   Total Bilirubin 0.4 0.3 - 1.2 mg/dL   GFR, Estimated >60 >60 mL/min   Anion gap 7 5 - 15  Magnesium     Status: None   Collection Time: 09/08/21  6:28 AM  Result Value Ref Range   Magnesium 2.1 1.7 - 2.4 mg/dL  Phosphorus     Status: Abnormal   Collection  Time: 09/08/21  6:28 AM  Result Value Ref Range   Phosphorus 2.4 (L) 2.5 - 4.6 mg/dL  BLOOD TRANSFUSION REPORT - SCANNED     Status: None   Collection Time: 09/08/21 10:50 AM   Narrative   Ordered by an unspecified provider.      Assessment/Plan: POD#35 s/p Hysteroscopic Sonata procedure for uterine fibroids 1) Septic Shock likely d/t PID. Resolving - WBC 10.5 - Will change to PO abx 2) AKI: Resolved - Cr 0.8 3) Anemia: S/P 1 unit pRBCs - Hgb 7.4, stable 4) Thrombocytopenia - PLTs 60K, was 99K on 2/18. Platelet count on CBC on 2/19 clumped and not estimated. Uncertain if this represents an acute change - No evidence of bleeding - Will D/C Heparin - Check HIT Ab panel  LOS: 3 days    Vanessa Kick 09/08/2021, 1:15 PM

## 2021-09-08 NOTE — Plan of Care (Signed)

## 2021-09-08 NOTE — Progress Notes (Signed)
*  LATE ENTRY 2/2 MULTIPLE DELIVERIES* Patient seen after she was transported to 6N floor. Staff working on IV placement to continue IV antibiotics. Patient states she feels much better from the morning, pain is minimal but still in lower abdomen and back. Only mild to light vaginal bleeding at this time. Able to tolerate fruit from her dinner. Still has not ahd BM since yesterday but has had some flatus. Denies N/V. Feels more energetic. Walked on her own, with RN in room, to the bathroom to void and back to bed. Encouraged by progress. Would continue to trend CBC, CMP and Mag/Phos daily given AKI/anemia/thrombocytopenia from earlier. All questions answered BP 129/89 (BP Location: Right Arm)    Pulse (!) 110    Temp 99.7 F (37.6 C) (Oral)    Resp 17    Ht 5\' 1"  (1.549 m)    Wt 86 kg    SpO2 97%    BMI 35.82 kg/m

## 2021-09-09 DIAGNOSIS — A498 Other bacterial infections of unspecified site: Secondary | ICD-10-CM

## 2021-09-09 DIAGNOSIS — R7881 Bacteremia: Secondary | ICD-10-CM

## 2021-09-09 DIAGNOSIS — N730 Acute parametritis and pelvic cellulitis: Secondary | ICD-10-CM | POA: Diagnosis not present

## 2021-09-09 DIAGNOSIS — R6520 Severe sepsis without septic shock: Secondary | ICD-10-CM

## 2021-09-09 DIAGNOSIS — N179 Acute kidney failure, unspecified: Secondary | ICD-10-CM | POA: Diagnosis not present

## 2021-09-09 LAB — BPAM RBC
Blood Product Expiration Date: 202303192359
Blood Product Expiration Date: 202303212359
ISSUE DATE / TIME: 202302171506
Unit Type and Rh: 5100
Unit Type and Rh: 5100

## 2021-09-09 LAB — TYPE AND SCREEN
ABO/RH(D): O POS
Antibody Screen: NEGATIVE
Unit division: 0
Unit division: 0

## 2021-09-09 LAB — CBC WITH DIFFERENTIAL/PLATELET
Abs Immature Granulocytes: 0.2 10*3/uL — ABNORMAL HIGH (ref 0.00–0.07)
Basophils Absolute: 0 10*3/uL (ref 0.0–0.1)
Basophils Relative: 0 %
Eosinophils Absolute: 0.1 10*3/uL (ref 0.0–0.5)
Eosinophils Relative: 1 %
HCT: 24.9 % — ABNORMAL LOW (ref 36.0–46.0)
Hemoglobin: 7.5 g/dL — ABNORMAL LOW (ref 12.0–15.0)
Immature Granulocytes: 2 %
Lymphocytes Relative: 9 %
Lymphs Abs: 1 10*3/uL (ref 0.7–4.0)
MCH: 18.9 pg — ABNORMAL LOW (ref 26.0–34.0)
MCHC: 30.1 g/dL (ref 30.0–36.0)
MCV: 62.9 fL — ABNORMAL LOW (ref 80.0–100.0)
Monocytes Absolute: 0.7 10*3/uL (ref 0.1–1.0)
Monocytes Relative: 6 %
Neutro Abs: 9 10*3/uL — ABNORMAL HIGH (ref 1.7–7.7)
Neutrophils Relative %: 82 %
Platelets: 102 10*3/uL — ABNORMAL LOW (ref 150–400)
RBC: 3.96 MIL/uL (ref 3.87–5.11)
RDW: 21.9 % — ABNORMAL HIGH (ref 11.5–15.5)
WBC: 11 10*3/uL — ABNORMAL HIGH (ref 4.0–10.5)
nRBC: 0 % (ref 0.0–0.2)

## 2021-09-09 LAB — COMPREHENSIVE METABOLIC PANEL
ALT: 10 U/L (ref 0–44)
AST: 15 U/L (ref 15–41)
Albumin: 1.8 g/dL — ABNORMAL LOW (ref 3.5–5.0)
Alkaline Phosphatase: 76 U/L (ref 38–126)
Anion gap: 8 (ref 5–15)
BUN: 11 mg/dL (ref 6–20)
CO2: 21 mmol/L — ABNORMAL LOW (ref 22–32)
Calcium: 7.7 mg/dL — ABNORMAL LOW (ref 8.9–10.3)
Chloride: 107 mmol/L (ref 98–111)
Creatinine, Ser: 0.67 mg/dL (ref 0.44–1.00)
GFR, Estimated: >60 mL/min (ref >60)
Glucose, Bld: 97 mg/dL (ref 70–99)
Potassium: 3.4 mmol/L — ABNORMAL LOW (ref 3.5–5.1)
Sodium: 136 mmol/L (ref 135–145)
Total Bilirubin: 0.5 mg/dL (ref 0.3–1.2)
Total Protein: 5.7 g/dL — ABNORMAL LOW (ref 6.5–8.1)

## 2021-09-09 LAB — PHOSPHORUS: Phosphorus: 2.7 mg/dL (ref 2.5–4.6)

## 2021-09-09 LAB — MAGNESIUM: Magnesium: 1.8 mg/dL (ref 1.7–2.4)

## 2021-09-09 LAB — PATHOLOGIST SMEAR REVIEW

## 2021-09-09 LAB — HEPARIN INDUCED PLATELET AB (HIT ANTIBODY): Heparin Induced Plt Ab: 0.1 OD (ref 0.000–0.400)

## 2021-09-09 MED ORDER — AMOXICILLIN-POT CLAVULANATE 875-125 MG PO TABS
1.0000 | ORAL_TABLET | Freq: Two times a day (BID) | ORAL | Status: DC
  Administered 2021-09-09 – 2021-09-12 (×6): 1 via ORAL
  Filled 2021-09-09 (×6): qty 1

## 2021-09-09 NOTE — Consult Note (Signed)
[facility] for Infectious Disease    Date of Admission:  09/04/2021     Reason for Consult: PID     Referring Physician: Dr Marvel Plan  Current antibiotics: Doxycycline 2/16-present Metronidazole 2/16-present  Previous antibiotics: Cefepime 2/16 x 1 dose Ceftriaxone 2/17 - 2/19   ASSESSMENT:    36 y.o. female admitted with:  Fusobacterium necrophorum bacteremia: Secondary to PID in the setting of procedure for uterine fibroids on 08/04/2021 with OB/GYN.  Intra-abdominal imaging with CT scan and pelvic ultrasound did not show any evidence of abscess formation. Septic shock: Resolved. Acute kidney injury: Patient presented with creatinine of 3.33 which has normalized over the last 2 days.  RECOMMENDATIONS:    Will adjust antibiotics to Augmentin Plan for 2 weeks of total therapy through 09/19/2021 Will sign off, please call as needed.   Principal Problem:   Fusobacterium infection Active Problems:   Pelvic inflammatory disease, acute   Sepsis ([place])   Acute kidney injury ([place])   Bacteremia   MEDICATIONS:    Scheduled Meds:  amoxicillin-clavulanate  1 tablet Oral Q12H   Chlorhexidine Gluconate Cloth  6 each Topical Daily   famotidine  20 mg Oral BID   phosphorus  250 mg Oral Daily   polyethylene glycol  17 g Oral Daily   senna  1 tablet Oral Daily   Continuous Infusions:  sodium chloride     sodium chloride 250 mL (09/05/21 1930)   PRN Meds:.acetaminophen, docusate sodium, lip balm, ondansetron **OR** ondansetron (ZOFRAN) IV, oxyCODONE  HPI:    Hannah [name] is a 36 y.o. female with no significant past medical history admitted 09/04/2021 with septic shock.  Patient underwent Sonata transcervical radiofrequency ablation, hysteroscopy myomectomy with MyoSure for uterine leiomyoma on 08/04/2021 with OB/GYN.  Her postoperative course was uncomplicated, however, approximately 2 weeks postop she developed worsening pain and discharge.  Her symptoms were  unrelieved with ibuprofen and Tylenol.  She also had significant fatigue and subjective fevers.  She also reports some nausea, vomiting, and diarrhea.  She presented to the emergency department on 2/17 and was found to be febrile with hypotension, tachycardia.  Lab work revealed AKI and lactic acidosis.  She was given fluids and started on IV antibiotics.  She underwent CT of the abdomen/pelvis that did not show any acute abnormalities but did note a enlarged appearance of the left ovary with surrounding stranding.  Ultrasound was recommended and obtained which did not show any evidence of ovarian torsion.  It did note a uterine fibroid that was reduced in size from prior imaging.  She was initially admitted to the ICU with her septic shock.  She improved and was transferred to the floor.  She reports feeling better at this time, however, she is still having some fatigue and some occasional abdominal pain but this is improving.  She had some nausea and vomiting to metronidazole when given p.o. and so she is currently on IV therapy.  She had blood cultures obtained on admission which have grown Fusobacterium necrophorum in 2 out of 2 sets of cultures.  1 culture sent also grew Staphylococcus hominis which is suspected to be a contaminant.  HIV testing was negative and gonorrhea/chlamydia testing was also negative.   Past Medical History:  Diagnosis Date   Anemia    No pertinent past medical history     Social History   Tobacco Use   Smoking status: Never  Vaping Use   Vaping Use: Never used  Substance Use Topics  Alcohol use: No   Drug use: No    Family History  Problem Relation Age of Onset   Hypertension Mother    Other Neg Hx     Allergies  Allergen Reactions   Prednisone Swelling    Review of Systems  All other systems reviewed and are negative.  Except as noted above in the HPI.  OBJECTIVE:   Blood pressure (!) 140/94, pulse 88, temperature 98.4 F (36.9 C), temperature  source Oral, resp. rate 18, height 5\' 1"  (1.549 m), weight 86 kg, SpO2 100 %. Body mass index is 35.82 kg/m.  Physical Exam Constitutional:      General: She is not in acute distress.    Appearance: Normal appearance.  HENT:     Head: Normocephalic and atraumatic.  Eyes:     Extraocular Movements: Extraocular movements intact.     Conjunctiva/sclera: Conjunctivae normal.  Cardiovascular:     Rate and Rhythm: Normal rate and regular rhythm.  Pulmonary:     Effort: Pulmonary effort is normal. No respiratory distress.     Breath sounds: Normal breath sounds.  Abdominal:     General: There is no distension.     Palpations: Abdomen is soft.     Tenderness: There is no abdominal tenderness.  Musculoskeletal:        General: Normal range of motion.     Cervical back: Normal range of motion and neck supple.  Skin:    General: Skin is warm and dry.     Findings: No rash.  Neurological:     General: No focal deficit present.     Mental Status: She is alert and oriented to person, place, and time.  Psychiatric:        Mood and Affect: Mood normal.        Behavior: Behavior normal.     Lab Results: Lab Results  Component Value Date   WBC 11.0 (H) 09/09/2021   HGB 7.5 (L) 09/09/2021   HCT 24.9 (L) 09/09/2021   MCV 62.9 (L) 09/09/2021   PLT 102 (L) 09/09/2021    Lab Results  Component Value Date   NA 136 09/09/2021   K 3.4 (L) 09/09/2021   CO2 21 (L) 09/09/2021   GLUCOSE 97 09/09/2021   BUN 11 09/09/2021   CREATININE 0.67 09/09/2021   CALCIUM 7.7 (L) 09/09/2021   GFRNONAA >60 09/09/2021    Lab Results  Component Value Date   ALT 10 09/09/2021   AST 15 09/09/2021   ALKPHOS 76 09/09/2021   BILITOT 0.5 09/09/2021    No results found for: CRP  No results found for: ESRSEDRATE  I have reviewed the micro and lab results in Epic.  Imaging: No results found.   Imaging independently reviewed in Epic.  Raynelle Highland for Infectious Disease [facility] [phone] pager 09/09/2021, 4:10 PM

## 2021-09-09 NOTE — Progress Notes (Signed)
HD #5 PID/Septic shock  Pt in bed resting comfortably.  States is having no pain.  Had diarrhea today and yesterday.  Tolerated a small amount of po intake--not very hungry but nausea from yesterday resolved.  Has ambulated to bathroom but feels weak  Low grade temp 99.1-100  BP stable, tachycardia resolved  Abdomen soft NT, no rebound or guarding  Pt clinically slowly improving Blood cx returned with fusiform necrophorum  Pt currently on IV doxycycline/flagyl--failed oral therapy yesterday with N/V   Will get ID consult to tailor abx regimen given blood cx final and give opinion on duration/route of therapy Creatinine remains stable as is Hgb s/p 1 unit PRBC's Platelets at 102K  Will get PT consult to increase activity--SCD's while in bed as heparin d/c with low platelets ID consult for antibiotic regimen Pt works as Catering manager.  Note given that she will be out of work for at least 2 weeks.

## 2021-09-10 LAB — CBC WITH DIFFERENTIAL/PLATELET
Abs Immature Granulocytes: 0.21 10*3/uL — ABNORMAL HIGH (ref 0.00–0.07)
Basophils Absolute: 0 10*3/uL (ref 0.0–0.1)
Basophils Relative: 0 %
Eosinophils Absolute: 0.1 10*3/uL (ref 0.0–0.5)
Eosinophils Relative: 1 %
HCT: 23.3 % — ABNORMAL LOW (ref 36.0–46.0)
Hemoglobin: 7.3 g/dL — ABNORMAL LOW (ref 12.0–15.0)
Immature Granulocytes: 2 %
Lymphocytes Relative: 9 %
Lymphs Abs: 1 10*3/uL (ref 0.7–4.0)
MCH: 19.7 pg — ABNORMAL LOW (ref 26.0–34.0)
MCHC: 31.3 g/dL (ref 30.0–36.0)
MCV: 62.8 fL — ABNORMAL LOW (ref 80.0–100.0)
Monocytes Absolute: 0.7 10*3/uL (ref 0.1–1.0)
Monocytes Relative: 6 %
Neutro Abs: 9.7 10*3/uL — ABNORMAL HIGH (ref 1.7–7.7)
Neutrophils Relative %: 82 %
Platelets: 179 10*3/uL (ref 150–400)
RBC: 3.71 MIL/uL — ABNORMAL LOW (ref 3.87–5.11)
RDW: 22.1 % — ABNORMAL HIGH (ref 11.5–15.5)
Smear Review: NORMAL
WBC: 11.7 10*3/uL — ABNORMAL HIGH (ref 4.0–10.5)
nRBC: 0 % (ref 0.0–0.2)

## 2021-09-10 LAB — COMPREHENSIVE METABOLIC PANEL
ALT: 10 U/L (ref 0–44)
AST: 18 U/L (ref 15–41)
Albumin: 1.8 g/dL — ABNORMAL LOW (ref 3.5–5.0)
Alkaline Phosphatase: 70 U/L (ref 38–126)
Anion gap: 9 (ref 5–15)
BUN: 6 mg/dL (ref 6–20)
CO2: 21 mmol/L — ABNORMAL LOW (ref 22–32)
Calcium: 7.6 mg/dL — ABNORMAL LOW (ref 8.9–10.3)
Chloride: 105 mmol/L (ref 98–111)
Creatinine, Ser: 0.56 mg/dL (ref 0.44–1.00)
GFR, Estimated: >60 mL/min (ref >60)
Glucose, Bld: 102 mg/dL — ABNORMAL HIGH (ref 70–99)
Potassium: 3.1 mmol/L — ABNORMAL LOW (ref 3.5–5.1)
Sodium: 135 mmol/L (ref 135–145)
Total Bilirubin: 0.4 mg/dL (ref 0.3–1.2)
Total Protein: 5.4 g/dL — ABNORMAL LOW (ref 6.5–8.1)

## 2021-09-10 LAB — MAGNESIUM: Magnesium: 1.5 mg/dL — ABNORMAL LOW (ref 1.7–2.4)

## 2021-09-10 LAB — PHOSPHORUS: Phosphorus: 3.2 mg/dL (ref 2.5–4.6)

## 2021-09-10 NOTE — Evaluation (Signed)
Physical Therapy Evaluation Patient Details Name: LATAJAH THUMAN MRN: [id number] DOB: 03/11/1986 Today's Date: 09/10/2021  History of Present Illness  36 y.o. female presents to [facility] hospital on 09/04/2021 with sepsis, from presumed pelvic infection. PMH includes anemia.  Clinical Impression  Pt presents to PT with deficits in strength, power, endurance, activity tolerance, balance, gait. Pt is generally weak and fatigues quickly. Pt requires UE support to maintain standing balance, with one lateral loss of balance when attempting to ambulate without device. PT provides education on the need for progressive mobility and L exercise to aide in reducing LE edema and to improve endurance. Pt will benefit from continued acute PT services to aide in a return to independence.       Recommendations for follow up therapy are one component of a multi-disciplinary discharge planning process, led by the attending physician.  Recommendations may be updated based on patient status, additional functional criteria and insurance authorization.  Follow Up Recommendations No PT follow up    Assistance Recommended at Discharge Intermittent Supervision/Assistance  Patient can return home with the following  A little help with walking and/or transfers;Help with stairs or ramp for entrance;Assistance with cooking/housework;A little help with bathing/dressing/bathroom    Equipment Recommendations Rolling walker (2 wheels) (may progress to no needs)  Recommendations for Other Services       Functional Status Assessment Patient has had a recent decline in their functional status and demonstrates the ability to make significant improvements in function in a reasonable and predictable amount of time.     Precautions / Restrictions Precautions Precautions: Fall Restrictions Weight Bearing Restrictions: No      Mobility  Bed Mobility Overal bed mobility: Needs Assistance Bed Mobility: Supine to Sit      Supine to sit: Modified independent (Device/Increase time)     General bed mobility comments: increased time    Transfers Overall transfer level: Needs assistance Equipment used: None Transfers: Sit to/from Stand Sit to Stand: Min guard                Ambulation/Gait Ambulation/Gait assistance: Supervision, Min assist Gait Distance (Feet): 100 Feet (10' without device) Assistive device: None, Rolling walker (2 wheels) Gait Pattern/deviations: Step-through pattern Gait velocity: reduced Gait velocity interpretation: <1.8 ft/sec, indicate of risk for recurrent falls   General Gait Details: pt with slowed step-through gait, reduced stride length. Pt requiring minA without device, one lateral loss of balance. Pt with improved stability with UE support of RW  Stairs            Wheelchair Mobility    Modified Rankin (Stroke Patients Only)       Balance Overall balance assessment: Needs assistance Sitting-balance support: No upper extremity supported, Feet supported Sitting balance-Leahy Scale: Good     Standing balance support: Bilateral upper extremity supported, Reliant on assistive device for balance, Single extremity supported                                 Pertinent Vitals/Pain Pain Assessment Pain Assessment: No/denies pain    Home Living Family/patient expects to be discharged to:: Private residence Living Arrangements: Children (74 y.o. 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        Extremity/Trunk Assessment    Upper Extremity Assessment Upper Extremity Assessment: Generalized weakness    Lower Extremity Assessment Lower Extremity Assessment: Generalized weakness    Cervical / Trunk Assessment Cervical / Trunk Assessment: Normal  Communication   Communication: No difficulties  Cognition Arousal/Alertness: Awake/alert Behavior During Therapy: WFL for tasks assessed/performed Overall Cognitive Status: Within Functional Limits for tasks assessed                                          General Comments General comments (skin integrity, edema, etc.): VSS on RA    Exercises     Assessment/Plan    PT Assessment Patient needs continued PT services  PT Problem List Decreased strength;Decreased activity tolerance;Decreased balance;Decreased mobility;Decreased knowledge of use of DME;Decreased safety awareness       PT Treatment Interventions DME instruction;Gait training;Stair training;Functional mobility training;Therapeutic activities;Therapeutic exercise;Balance training;Neuromuscular re-education;Patient/family education    PT Goals (Current goals can be found in the Care Plan section)  Acute Rehab PT Goals Patient Stated Goal: to return to independence PT Goal Formulation: With patient Time For Goal Achievement: 09/24/21 Potential to Achieve Goals: Good Additional Goals Additional Goal #1: Pt will score >19/24 on DGI to indicate a reduced risk for falls    Frequency Min 3X/week     Co-evaluation               AM-PAC PT "6 Clicks" Mobility  Outcome Measure Help needed turning from your back to your side while in a flat bed without using bedrails?: None Help needed moving from lying on your back to sitting on the side of a flat bed without using bedrails?: None Help needed moving to and from a bed to a chair (including a wheelchair)?: A Little Help needed standing up from a chair using your arms (e.g., wheelchair or bedside chair)?: A Little Help needed  to walk in hospital room?: A Little Help needed climbing 3-5 steps with a railing? : Total 6 Click Score: 18    End of Session   Activity Tolerance: Patient limited by fatigue Patient left: in chair;with call bell/phone within reach Nurse Communication: Mobility status PT Visit Diagnosis: Other abnormalities of gait and mobility (R26.89);Muscle weakness (generalized) (M62.81)    Time: 5027-7412 PT Time Calculation (min) (ACUTE ONLY): 24 min   Charges:   PT Evaluation $PT Eval Low Complexity: [place], PT, DPT Acute Rehabilitation Pager: [phone] Office [phone]   Zenaida Niece 09/10/2021, 1:41 PM

## 2021-09-10 NOTE — Progress Notes (Signed)
Patient ID: Hannah [name], female   DOB: [birth year], 36 y.o.   MRN: [id number]  HD#6  Admission for PID/Septic Shock Blood cultures positive for Fusubacterium necrophorum  S: Feeling much better, nausea improved. Some cramping. Overall whole body feels heavy. Working on ambulation.  O:  Vitals:   09/09/21 1934 09/10/21 0511 09/10/21 0513 09/10/21 0821  BP: (!) 137/98 (!) 128/94 (!) 128/94 125/80  Pulse: 93 88 88 85  Resp: 18  18 16   Temp: 99.6 F (37.6 C) 99.4 F (37.4 C) 99.4 F (37.4 C) 98.5 F (36.9 C)  TempSrc: Oral Oral Oral Oral  SpO2: 100% 100% 100% 99%  Weight:   90.2 kg   Height:       AOX3, NAD Abd soft/NT  Results for orders placed or performed during the hospital encounter of 09/04/21 (from the past 24 hour(s))  CBC with Differential/Platelet     Status: Abnormal   Collection Time: 09/10/21  2:54 AM  Result Value Ref Range   WBC 11.7 (H) 4.0 - 10.5 K/uL   RBC 3.71 (L) 3.87 - 5.11 MIL/uL   Hemoglobin 7.3 (L) 12.0 - 15.0 g/dL   HCT 23.3 (L) 36.0 - 46.0 %   MCV 62.8 (L) 80.0 - 100.0 fL   MCH 19.7 (L) 26.0 - 34.0 pg   MCHC 31.3 30.0 - 36.0 g/dL   RDW 22.1 (H) 11.5 - 15.5 %   Platelets 179 150 - 400 K/uL   nRBC 0.0 0.0 - 0.2 %   Neutrophils Relative % 82 %   Neutro Abs 9.7 (H) 1.7 - 7.7 K/uL   Lymphocytes Relative 9 %   Lymphs Abs 1.0 0.7 - 4.0 K/uL   Monocytes Relative 6 %   Monocytes Absolute 0.7 0.1 - 1.0 K/uL   Eosinophils Relative 1 %   Eosinophils Absolute 0.1 0.0 - 0.5 K/uL   Basophils Relative 0 %   Basophils Absolute 0.0 0.0 - 0.1 K/uL   WBC Morphology MORPHOLOGY UNREMARKABLE    RBC Morphology See Note    Smear Review Normal platelet morphology    Immature Granulocytes 2 %   Abs Immature Granulocytes 0.21 (H) 0.00 - 0.07 K/uL   Polychromasia PRESENT    Target Cells PRESENT   Comprehensive metabolic panel     Status: Abnormal   Collection Time: 09/10/21  2:54 AM  Result Value Ref Range   Sodium 135 135 - 145 mmol/L   Potassium 3.1 (L) 3.5  - 5.1 mmol/L   Chloride 105 98 - 111 mmol/L   CO2 21 (L) 22 - 32 mmol/L   Glucose, Bld 102 (H) 70 - 99 mg/dL   BUN 6 6 - 20 mg/dL   Creatinine, Ser 0.56 0.44 - 1.00 mg/dL   Calcium 7.6 (L) 8.9 - 10.3 mg/dL   Total Protein 5.4 (L) 6.5 - 8.1 g/dL   Albumin 1.8 (L) 3.5 - 5.0 g/dL   AST 18 15 - 41 U/L   ALT 10 0 - 44 U/L   Alkaline Phosphatase 70 38 - 126 U/L   Total Bilirubin 0.4 0.3 - 1.2 mg/dL   GFR, Estimated >60 >60 mL/min   Anion gap 9 5 - 15  Magnesium     Status: Abnormal   Collection Time: 09/10/21  2:54 AM  Result Value Ref Range   Magnesium 1.5 (L) 1.7 - 2.4 mg/dL  Phosphorus     Status: None   Collection Time: 09/10/21  2:54 AM  Result Value Ref Range  Phosphorus 3.2 2.5 - 4.6 mg/dL   A/P  1) Bacterimia: Now on PO augmentin, started yesterday evening. Not quite 24 hours. Continue po abx for 24 hrs 2) Septic shock resolved 3) AKI: resolved 4) Ambulation: working with PT to improve ambulation tolerance. May need walker and shower chair initially at home. Pt lives on 2nd floor apartment building

## 2021-09-11 LAB — CBC WITH DIFFERENTIAL/PLATELET
Abs Immature Granulocytes: 0.14 10*3/uL — ABNORMAL HIGH (ref 0.00–0.07)
Basophils Absolute: 0 10*3/uL (ref 0.0–0.1)
Basophils Relative: 0 %
Eosinophils Absolute: 0.1 10*3/uL (ref 0.0–0.5)
Eosinophils Relative: 1 %
HCT: 24.8 % — ABNORMAL LOW (ref 36.0–46.0)
Hemoglobin: 7.5 g/dL — ABNORMAL LOW (ref 12.0–15.0)
Immature Granulocytes: 1 %
Lymphocytes Relative: 8 %
Lymphs Abs: 0.8 10*3/uL (ref 0.7–4.0)
MCH: 19.1 pg — ABNORMAL LOW (ref 26.0–34.0)
MCHC: 30.2 g/dL (ref 30.0–36.0)
MCV: 63.3 fL — ABNORMAL LOW (ref 80.0–100.0)
Monocytes Absolute: 0.6 10*3/uL (ref 0.1–1.0)
Monocytes Relative: 6 %
Neutro Abs: 8.6 10*3/uL — ABNORMAL HIGH (ref 1.7–7.7)
Neutrophils Relative %: 84 %
Platelets: 271 10*3/uL (ref 150–400)
RBC: 3.92 MIL/uL (ref 3.87–5.11)
RDW: 22.8 % — ABNORMAL HIGH (ref 11.5–15.5)
WBC: 10.1 10*3/uL (ref 4.0–10.5)
nRBC: 0 % (ref 0.0–0.2)

## 2021-09-11 LAB — PHOSPHORUS: Phosphorus: 3.2 mg/dL (ref 2.5–4.6)

## 2021-09-11 LAB — MAGNESIUM: Magnesium: 1.5 mg/dL — ABNORMAL LOW (ref 1.7–2.4)

## 2021-09-11 NOTE — Progress Notes (Signed)
Patient is eating, ambulating, and voiding.  Pain control is good.  Vitals:   09/10/21 0821 09/10/21 1545 09/10/21 2014 09/11/21 0511  BP: 125/80 132/87 138/81 133/85  Pulse: 85 84 94 83  Resp: 16 20 18 18   Temp: 98.5 F (36.9 C) 98.8 F (37.1 C) 98.4 F (36.9 C) 98.6 F (37 C)  TempSrc: Oral Oral Oral Oral  SpO2: 99% 100% 100% 99%  Weight:    83.4 kg  Height:        lungs:   clear to auscultation cor:    RRR Abdomen:  soft, appropriate tenderness, incisions intact and without erythema or exudate. ex:    no cords   Lab Results  Component Value Date   WBC 11.7 (H) 09/10/2021   HGB 7.3 (L) 09/10/2021   HCT 23.3 (L) 09/10/2021   MCV 62.8 (L) 09/10/2021   PLT 179 09/10/2021    A/P  HD#7 for PID/septic shock after sonata.  Blood cultures came back Fusubacterium necrophorum.    Pt stable on PO Augmentin.  Working with PT to further ambulation and ADL.     Pt tolerating meds and no more nausea.  Consider d/c today or tomorrow.

## 2021-09-12 LAB — CBC WITH DIFFERENTIAL/PLATELET
Abs Immature Granulocytes: 0.1 10*3/uL — ABNORMAL HIGH (ref 0.00–0.07)
Basophils Absolute: 0 10*3/uL (ref 0.0–0.1)
Basophils Relative: 0 %
Eosinophils Absolute: 0.1 10*3/uL (ref 0.0–0.5)
Eosinophils Relative: 1 %
HCT: 23.9 % — ABNORMAL LOW (ref 36.0–46.0)
Hemoglobin: 7.1 g/dL — ABNORMAL LOW (ref 12.0–15.0)
Immature Granulocytes: 1 %
Lymphocytes Relative: 10 %
Lymphs Abs: 0.9 10*3/uL (ref 0.7–4.0)
MCH: 19.1 pg — ABNORMAL LOW (ref 26.0–34.0)
MCHC: 29.7 g/dL — ABNORMAL LOW (ref 30.0–36.0)
MCV: 64.4 fL — ABNORMAL LOW (ref 80.0–100.0)
Monocytes Absolute: 0.6 10*3/uL (ref 0.1–1.0)
Monocytes Relative: 6 %
Neutro Abs: 8.3 10*3/uL — ABNORMAL HIGH (ref 1.7–7.7)
Neutrophils Relative %: 82 %
Platelets: 321 10*3/uL (ref 150–400)
RBC: 3.71 MIL/uL — ABNORMAL LOW (ref 3.87–5.11)
RDW: 23.2 % — ABNORMAL HIGH (ref 11.5–15.5)
WBC: 9.9 10*3/uL (ref 4.0–10.5)
nRBC: 0 % (ref 0.0–0.2)

## 2021-09-12 LAB — MAGNESIUM: Magnesium: 1.4 mg/dL — ABNORMAL LOW (ref 1.7–2.4)

## 2021-09-12 LAB — PHOSPHORUS: Phosphorus: 3 mg/dL (ref 2.5–4.6)

## 2021-09-12 MED ORDER — ACETAMINOPHEN 500 MG PO TABS: 1000.0000 mg | ORAL_TABLET | Freq: Four times a day (QID) | ORAL | 0 refills | Status: DC | PRN

## 2021-09-12 MED ORDER — IBUPROFEN 800 MG PO TABS: 800.0000 mg | ORAL_TABLET | Freq: Three times a day (TID) | ORAL | 0 refills | Status: DC | PRN

## 2021-09-12 MED ORDER — AMOXICILLIN-POT CLAVULANATE 875-125 MG PO TABS: 1.0000 | ORAL_TABLET | Freq: Two times a day (BID) | ORAL | 0 refills | Status: AC

## 2021-09-12 MED ORDER — MAGNESIUM GLUCONATE 500 (27 MG) MG PO TABS: 500.0000 mg | ORAL_TABLET | Freq: Two times a day (BID) | ORAL | 0 refills | Status: AC

## 2021-09-12 NOTE — TOC Progression Note (Addendum)
Transition of Care [facility]) - Progression Note    Patient Details  Name: Hannah [name] MRN: [id number] Date of Birth: 28-Jan-1986  Transition of Care [facility]) CM/SW Contact  Jacalyn Lefevre Edson Snowball, RN Phone Number: 09/12/2021, 9:59 AM  Clinical Narrative:     Gilford Rile ordered for home. Spoke to [employer] with [place].    1025 Patient has decided she does not want walker Jasmin with [employer] aware    Transition of Care (TOC) Screening Note   Patient Details  Name: Hannah [name] Date of Birth: 1985-09-15   Transition of Care [facility]) CM/SW Contact:    Marilu Favre, RN Phone Number: 09/12/2021, 10:00 AM    Transition of Care Department [facility]) has reviewed patient and no TOC needs have been identified at this time. We will continue to monitor patient advancement through interdisciplinary progression rounds. If new patient transition needs arise, please place a TOC consult.    Expected Discharge Plan and Services           Expected Discharge Date: 09/12/21                                     Social Determinants of Health (SDOH) Interventions    Readmission Risk Interventions No flowsheet data found.

## 2021-09-12 NOTE — Discharge Summary (Signed)
Physician Discharge Summary  Patient ID: Hannah [name] MRN: [id number] DOB/AGE: [birth year] 36 y.o.  Admit date: 09/04/2021 Discharge date: 09/12/2021  Admission Diagnoses: acute kidney injury, sepsis, post op sonata  Discharge Diagnoses:  Principal Problem:   Fusobacterium infection Active Problems:   Pelvic inflammatory disease, acute   Sepsis ([place])   Acute kidney injury ([place])   Bacteremia   Discharged Condition: good  Hospital Course:  36 y.o. [id number] presents to the Emergency Department with nausea, vomiting, diarrhea and abdominal pain.  She has a history of iron deficiency anemia, uterine fibroids, and heavy menstrual bleeding.  She underwent a Sonata procedure (hysteroscopy with radiofrequency ablation) of her 8 cm anterior fibroid on 08/04/2021 with Dr. Brien Mates.  She felt well the first two weeks post-op, with anticipated mild intermittent cramping and vaginal discharge.  She notes the discharge changed about two weeks ago, becoming more thick and copious.  Over the last two weeks, she started to have low back pain and cramping in the evenings (but felt findn during the daytime hours).  Ibuprofen helped.  She started to just feel more poorly over the past week, with increased pelvic pain, low back pain and decreased appetite.  She developed nausea, vomiting and diarrhea yesterday as well as a fever of 103F at home.     Upon presentation to the ED, she was noted to be septic (hypotensive, tachycardic, elevated lactic acid as well as acute kidney injury with a creatinine of 3.3.).  She was fluid resuscitated, blood cultures drawn and started on empiric cefepime.    She underwent CT abdomen and pelvis which was overall unremarkable.  In particular, there was no evidence of bowel perforation or intra-abdominal fluid collections. The appendix and colon were normal. The left ovary was mildly enlarged with some stranding, so a a pelvic ultrasound was obtained that was also normal.  The  fibroid had decreased in size to 5.5 cm.     She denies upper respiratory symptoms, associated sick contacts.  Denies dysuria, urgency, frequency.     Pt was treated with sepsis protocol and rocephin/doxy/flagyl IV.  Her sepsis and AKC resolved quickly.  Pt transitioned to po antibiotics on HD 4.  She was unable to tolerate po Flagyl and was changed to Augmentin on HD 5.  She remained Afeb during this time.  Blood cultures grew out Fusobacterium necrophorum which is treated appropriately by Augmentin.  Pt received pt consult on HD 5.  Pt is discharged today with no nausea, afebrile and eating and ambulating.  Consults: pulmonary/intensive care and ID  Significant Diagnostic Studies: radiology: CT scan:    Treatments: antibiotics: see above  Discharge Exam: Blood pressure 130/84, pulse 79, temperature 98.2 F (36.8 C), temperature source Oral, resp. rate 17, height 5\' 1"  (1.549 m), weight 87.5 kg, SpO2 100 %.   Disposition: Discharge disposition: 01-Home or Self Care       Discharge Instructions     Call MD for:  temperature >100.4   Complete by: As directed    Diet - low sodium heart healthy   Complete by: As directed    Discharge instructions   Complete by: As directed    No driving on narcotics, no sexual activity for 2 weeks.   Increase activity slowly   Complete by: As directed    May shower / Bathe   Complete by: As directed    Shower, no bath for 2 weeks.   Remove dressing in 24 hours   Complete by: As  directed    Sexual Activity Restrictions   Complete by: As directed    No sexual activity for 2 weeks.   Walker standard   Complete by: As directed       Allergies as of 09/12/2021       Reactions   Prednisone Swelling        Medication List     TAKE these medications    acetaminophen 500 MG tablet Commonly known as: TYLENOL Take 2 tablets (1,000 mg total) by mouth every 6 (six) hours as needed for mild pain.   amoxicillin-clavulanate 875-125 MG  tablet Commonly known as: AUGMENTIN Take 1 tablet by mouth every 12 (twelve) hours for 5 days.   ibuprofen 800 MG tablet Commonly known as: ADVIL Take 1 tablet (800 mg total) by mouth every 8 (eight) hours as needed. What changed:  medication strength how much to take when to take this reasons to take this   Slow Fe 142 (45 Fe) MG Tbcr Generic drug: Ferrous Sulfate Take 142 mg by mouth daily.        Follow-up Information     Rowland Lathe, MD Follow up in 1 week(s).   Specialty: Obstetrics and Gynecology Contact information: [address] [place] [address] [phone]                 Signed: Daria Pastures 09/12/2021, 7:02 AM

## 2021-09-12 NOTE — Progress Notes (Signed)
Patient is eating, ambulating, and voiding.  Pain control is good.  Vitals:   09/11/21 0511 09/11/21 1605 09/11/21 1939 09/12/21 0410  BP: 133/85 126/85 138/77 130/84  Pulse: 83 86 91 79  Resp: 18 18 18 17   Temp: 98.6 F (37 C) 98.9 F (37.2 C) 98.6 F (37 C) 98.2 F (36.8 C)  TempSrc: Oral Oral Oral Oral  SpO2: 99% 100% 100% 100%  Weight: 83.4 kg   87.5 kg  Height:        lungs:   clear to auscultation cor:    RRR Abdomen:  soft, appropriate tenderness, incisions intact and without erythema or exudate. ex:    no cords   Lab Results  Component Value Date   WBC 9.9 09/12/2021   HGB 7.1 (L) 09/12/2021   HCT 23.9 (L) 09/12/2021   MCV 64.4 (L) 09/12/2021   PLT 321 09/12/2021    A/P  Pt has been stable on PO antibiotics.  PT did not see yesterday but pt is ready to go home.  D/c today.  F/u with Dr. Brien Mates next week. Iron for anemia.

## 2021-09-12 NOTE — Plan of Care (Signed)

## 2021-09-12 NOTE — Progress Notes (Signed)
Physical Therapy Treatment Patient Details Name: Hannah [name] MRN: [id number] DOB: [birth year] Today's Date: 09/12/2021   History of Present Illness 36 y.o. female presents to [facility] hospital on 09/04/2021 with sepsis, from presumed pelvic infection. PMH includes anemia.    PT Comments    Pt received OOB in recliner upon arrival and agreeable to session focusing on stair training and education for safe return home. Pt able to ascend/descend 12 stairs (full flight) in stairwell with min assist for sequencing and safety. Educated pt on stair strategies; step to sequence "up with strong, down with weak", HHA from family, and 2 hands on single rail, with pt able to safely return demo of all with no LOB. Pt continues with slow, steady gait with RW with good tolerance for increased distance. Anticipate safe pt discharge with family/friend assistance once medically cleared, will follow acutely. Pt continues to benefit from skilled PT services to progress toward functional mobility goals.     Recommendations for follow up therapy are one component of a multi-disciplinary discharge planning process, led by the attending physician.  Recommendations may be updated based on patient status, additional functional criteria and insurance authorization.  Follow Up Recommendations  No PT follow up     Assistance Recommended at Discharge Intermittent Supervision/Assistance  Patient can return home with the following A little help with walking and/or transfers;Help with stairs or ramp for entrance;Assistance with cooking/housework;A little help with bathing/dressing/bathroom   Equipment Recommendations  Rolling walker (2 wheels)    Recommendations for Other Services       Precautions / Restrictions Precautions Precautions: Fall Restrictions Weight Bearing Restrictions: No     Mobility  Bed Mobility       General bed mobility comments: pt OOB in recliner upon arrival    Transfers Overall  transfer level: Needs assistance Equipment used: None, Rolling walker (2 wheels) Transfers: Sit to/from Stand Sit to Stand: Min guard, Supervision           General transfer comment: very light cueing for hand placement on ascent    Ambulation/Gait Ambulation/Gait assistance: Supervision, Min guard Gait Distance (Feet): 100 Feet Assistive device: None, Rolling walker (2 wheels) Gait Pattern/deviations: Step-through pattern Gait velocity: reduced     General Gait Details: pt with slowed step-through gait, reduced stride length.  Pt with good stability with UE support of RW. one seated rest break needed halfway   Stairs Stairs: Yes Stairs assistance: Min guard, Min assist Stair Management: One rail Right, Step to pattern, Forwards Number of Stairs: 12 (flight) General stair comments: slow steady step-to stair negotiation, pt able to demonstrate safe technique with HHA, and 2 hans on one rail both ascending/descending   Wheelchair Mobility    Modified Rankin (Stroke Patients Only)       Balance Overall balance assessment: Needs assistance Sitting-balance support: No upper extremity supported, Feet supported Sitting balance-Leahy Scale: Good     Standing balance support: Bilateral upper extremity supported, Reliant on assistive device for balance, Single extremity supported                                Cognition Arousal/Alertness: Awake/alert Behavior During Therapy: WFL for tasks assessed/performed Overall Cognitive Status: Within Functional Limits for tasks assessed  Exercises      General Comments        Pertinent Vitals/Pain      Home Living                          Prior Function            PT Goals (current goals can now be found in the care plan section) Acute Rehab PT Goals Patient Stated Goal: to return to independence PT Goal Formulation: With patient Time  For Goal Achievement: 09/24/21 Potential to Achieve Goals: Good    Frequency    Min 3X/week      PT Plan      Co-evaluation              AM-PAC PT "6 Clicks" Mobility   Outcome Measure  Help needed turning from your back to your side while in a flat bed without using bedrails?: None Help needed moving from lying on your back to sitting on the side of a flat bed without using bedrails?: None Help needed moving to and from a bed to a chair (including a wheelchair)?: A Little Help needed standing up from a chair using your arms (e.g., wheelchair or bedside chair)?: A Little Help needed to walk in hospital room?: A Little Help needed climbing 3-5 steps with a railing? : A Little 6 Click Score: 20    End of Session Equipment Utilized During Treatment: Gait belt Activity Tolerance: Patient limited by fatigue Patient left: in chair;with call bell/phone within reach Nurse Communication: Mobility status PT Visit Diagnosis: Other abnormalities of gait and mobility (R26.89);Muscle weakness (generalized) (M62.81)     Time: 2130-8657 PT Time Calculation (min) (ACUTE ONLY): 24 min  Charges:  $Gait Training: 23-37 mins                     Kamora Vossler R. PTA Acute Rehabilitation Services Office: [place] 09/12/2021, 10:05 AM

## 2021-12-29 ENCOUNTER — Non-Acute Institutional Stay (HOSPITAL_COMMUNITY)
Admission: RE | Admit: 2021-12-29 | Discharge: 2021-12-29 | Disposition: A | Discharge status: Home / Self Care | Payer: BC Managed Care – PPO | Source: Ambulatory Visit | Attending: Internal Medicine | Admitting: Internal Medicine

## 2021-12-29 DIAGNOSIS — D649 Anemia, unspecified: Secondary | ICD-10-CM | POA: Diagnosis present

## 2021-12-29 MED ORDER — SODIUM CHLORIDE 0.9 % IV SOLN: INTRAVENOUS | Status: DC | PRN

## 2021-12-29 MED ORDER — SODIUM CHLORIDE 0.9 % IV SOLN
500.0000 mg | Freq: Once | INTRAVENOUS | Status: AC
  Administered 2021-12-29: 500 mg via INTRAVENOUS
  Filled 2021-12-29: qty 25

## 2021-12-29 NOTE — Progress Notes (Signed)
PATIENT CARE CENTER NOTE   Diagnosis: Anemia, unspecified D64.9   Provider: Irene Pap, MD   Procedure: Venofer infusion    Note:  Patient received Venofer 500 mg infusion (dose # 1 of 2) via PIV. No pre-medications ordered. Patient tolerated infusion well with no adverse reaction. Vital signs stable. Discharge instructions given. Patient scheduled to come back next Tuesday, June 20th for second infusion. Alert, oriented and ambulatory at discharge.

## 2022-01-06 ENCOUNTER — Non-Acute Institutional Stay (HOSPITAL_COMMUNITY)
Admission: RE | Admit: 2022-01-06 | Discharge: 2022-01-06 | Disposition: A | Discharge status: Home / Self Care | Payer: BC Managed Care – PPO | Source: Ambulatory Visit | Attending: Internal Medicine | Admitting: Internal Medicine

## 2022-01-06 DIAGNOSIS — D649 Anemia, unspecified: Secondary | ICD-10-CM | POA: Diagnosis present

## 2022-01-06 MED ORDER — SODIUM CHLORIDE 0.9 % IV SOLN: INTRAVENOUS | Status: DC | PRN

## 2022-01-06 MED ORDER — SODIUM CHLORIDE 0.9 % IV SOLN
500.0000 mg | INTRAVENOUS | Status: DC
  Administered 2022-01-06: 500 mg via INTRAVENOUS
  Filled 2022-01-06: qty 25

## 2022-01-06 NOTE — Progress Notes (Signed)
PATIENT CARE CENTER NOTE:  Diagnosis: D64.9 Anemia unspecified   Provider: Irene Pap MD  Procedure: Venofer '500mg'$     Patient received IV Venofer ( dose 2 of 2). No premeds required per orders.  Tolerated well, vitals stable, pt has no complaints . Post infusion BP 141/92, provider's RN Christy notified, who spoke with Dr. Brien Mates and verbal order given to discharge patient , no further intervention needed. Pt declined AVS. Patient alert, oriented, and ambulatory at the time of discharge.

## 2022-05-08 ENCOUNTER — Non-Acute Institutional Stay (HOSPITAL_COMMUNITY)
Admission: RE | Admit: 2022-05-08 | Discharge: 2022-05-08 | Disposition: A | Discharge status: Home / Self Care | Payer: BC Managed Care – PPO | Source: Ambulatory Visit | Attending: Internal Medicine | Admitting: Internal Medicine

## 2022-05-08 VITALS — BP 164/93 | HR 71 | Temp 97.8°F | Resp 16

## 2022-05-08 DIAGNOSIS — D649 Anemia, unspecified: Secondary | ICD-10-CM | POA: Insufficient documentation

## 2022-05-08 LAB — PREPARE RBC (CROSSMATCH)

## 2022-05-08 MED ORDER — SODIUM CHLORIDE 0.9% IV SOLUTION: Freq: Once | INTRAVENOUS | Status: AC

## 2022-05-08 NOTE — Progress Notes (Signed)
PATIENT CARE CENTER NOTE   Diagnosis: Anemia, unspecified D64.9   Provider: Irene Pap, MD   Procedure: Blood transfusion    Note:  Patient received 2 units PRBC's via PIV. No pre-medications ordered. Pre-transfusion, patient's BP elevated at 149/91. Notified provider at [facility] of patient's elevated BP. Per provider, alright to proceed with blood transfusion but patient is to follow-up with OBGYN office concerning elevated BP. Patient tolerated transfusion well. BP remained elevated but stable. Discharge instructions given. Patient advised to call [facility] today to follow-up on elevated BP. Patient may also require another blood transfusion in 2 weeks. Patient scheduled for next transfusion but advised to notify [facility] to cancel appointment if transfusion not needed. Patient expresses an understanding. Patient alert, oriented and ambulatory at discharge.

## 2022-05-11 LAB — BPAM RBC
Blood Product Expiration Date: 202311192359
Blood Product Expiration Date: 202311192359
ISSUE DATE / TIME: 202310201004
ISSUE DATE / TIME: 202310201004
Unit Type and Rh: 5100
Unit Type and Rh: 5100

## 2022-05-11 LAB — TYPE AND SCREEN
ABO/RH(D): O POS
Antibody Screen: NEGATIVE
Unit division: 0
Unit division: 0

## 2022-05-14 ENCOUNTER — Encounter (HOSPITAL_BASED_OUTPATIENT_CLINIC_OR_DEPARTMENT_OTHER): Payer: Self-pay | Admitting: Obstetrics and Gynecology

## 2022-05-14 DIAGNOSIS — D649 Anemia, unspecified: Secondary | ICD-10-CM

## 2022-05-14 HISTORY — DX: Anemia, unspecified: D64.9

## 2022-05-18 ENCOUNTER — Encounter (HOSPITAL_BASED_OUTPATIENT_CLINIC_OR_DEPARTMENT_OTHER): Payer: Self-pay | Admitting: Obstetrics and Gynecology

## 2022-05-18 ENCOUNTER — Encounter (HOSPITAL_COMMUNITY)
Admission: RE | Admit: 2022-05-18 | Discharge: 2022-05-18 | Disposition: A | Discharge status: Home / Self Care | Payer: BC Managed Care – PPO | Source: Ambulatory Visit | Attending: Obstetrics and Gynecology | Admitting: Obstetrics and Gynecology

## 2022-05-18 ENCOUNTER — Other Ambulatory Visit: Payer: Self-pay

## 2022-05-18 DIAGNOSIS — Z01818 Encounter for other preprocedural examination: Secondary | ICD-10-CM

## 2022-05-18 DIAGNOSIS — Z01812 Encounter for preprocedural laboratory examination: Secondary | ICD-10-CM | POA: Diagnosis present

## 2022-05-18 LAB — CBC WITH DIFFERENTIAL/PLATELET
Abs Immature Granulocytes: 0.02 10*3/uL (ref 0.00–0.07)
Basophils Absolute: 0 10*3/uL (ref 0.0–0.1)
Basophils Relative: 1 %
Eosinophils Absolute: 0.1 10*3/uL (ref 0.0–0.5)
Eosinophils Relative: 1 %
HCT: 34.3 % — ABNORMAL LOW (ref 36.0–46.0)
Hemoglobin: 9.3 g/dL — ABNORMAL LOW (ref 12.0–15.0)
Immature Granulocytes: 0 %
Lymphocytes Relative: 12 %
Lymphs Abs: 0.7 10*3/uL (ref 0.7–4.0)
MCH: 18 pg — ABNORMAL LOW (ref 26.0–34.0)
MCHC: 27.1 g/dL — ABNORMAL LOW (ref 30.0–36.0)
MCV: 66.2 fL — ABNORMAL LOW (ref 80.0–100.0)
Monocytes Absolute: 0.3 10*3/uL (ref 0.1–1.0)
Monocytes Relative: 5 %
Neutro Abs: 4.9 10*3/uL (ref 1.7–7.7)
Neutrophils Relative %: 81 %
Platelets: 417 10*3/uL — ABNORMAL HIGH (ref 150–400)
RBC: 5.18 MIL/uL — ABNORMAL HIGH (ref 3.87–5.11)
RDW: 25.3 % — ABNORMAL HIGH (ref 11.5–15.5)
WBC: 6 10*3/uL (ref 4.0–10.5)
nRBC: 0 % (ref 0.0–0.2)

## 2022-05-18 NOTE — Progress Notes (Addendum)
Spoke w/ via phone for pre-op interview---Hannah [name] needs dos----urine pregnancy , type & screen (could not do at pre-op [name] appt due to a recent blood transfusion)             [name] results------05/18/22 [name] appt for cbc w/ diff & plts, type & screen, 09/04/21 chest xray in Epic, 09/08/21 EKG in Epic & chart COVID test -----patient states asymptomatic no test needed Arrive at -------0530 on Friday, 05/22/22 NPO after MN NO Solid Food.  Clear liquids from MN until---0430 Med rec completed Medications to take morning of surgery -----birth control pill Diabetic medication -----n/a Patient instructed no nail polish to be worn day of surgery Patient instructed to bring photo id and insurance card day of surgery Patient aware to have Driver (ride ) / caregiver    for 24 hours after surgery - mother, Hannah [name] Patient Special Instructions -----Extended / overnight stay instructions given. Pre-Op special Istructions -----none Patient verbalized understanding of instructions that were given at this phone interview. Patient denies shortness of breath, chest pain, fever, cough at this phone interview.

## 2022-05-18 NOTE — Progress Notes (Signed)
Your procedure is scheduled on Friday, 05/22/22.  Report to [facility].   Call this number if you have problems the morning of surgery  :[phone].   OUR ADDRESS IS [address].  WE ARE LOCATED IN [facility].  PLEASE BRING YOUR INSURANCE CARD AND PHOTO ID DAY OF SURGERY.  ONLY 2 PEOPLE ARE ALLOWED IN  WAITING  ROOM.                                      REMEMBER:  DO NOT EAT FOOD, CANDY GUM OR MINTS  AFTER MIDNIGHT THE NIGHT BEFORE YOUR SURGERY . YOU MAY HAVE CLEAR LIQUIDS FROM MIDNIGHT THE NIGHT BEFORE YOUR SURGERY UNTIL  4:30 AM. NO CLEAR LIQUIDS AFTER   4:30 AM DAY OF SURGERY.  YOU MAY  BRUSH YOUR TEETH MORNING OF SURGERY AND RINSE YOUR MOUTH OUT, NO CHEWING GUM CANDY OR MINTS.     CLEAR LIQUID DIET   Foods Allowed                                                                     Foods Excluded  Coffee and tea, regular and decaf                             liquids that you cannot  Plain Jell-O                                                                   see through such as: Fruit ices (not with fruit pulp)                                     milk, soups, orange juice  Plain  Popsicles                                    All solid food Carbonated beverages, regular and diet                                    Cranberry, grape and apple juices Sports drinks like Gatorade _____________________________________________________________________     TAKE ONLY THESE MEDICATIONS MORNING OF SURGERY: birth control pill   UP TO 4 VISITORS  MAY VISIT IN THE EXTENDED RECOVERY ROOM UNTIL 800 PM ONLY.  ONE  VISITOR AGE 18 AND OVER MAY SPEND THE NIGHT AND MUST BE IN EXTENDED RECOVERY ROOM NO LATER THAN 800 PM . YOUR DISCHARGE TIME AFTER YOU SPEND THE NIGHT IS 900 AM THE MORNING AFTER YOUR SURGERY.  YOU MAY PACK A SMALL OVERNIGHT BAG WITH TOILETRIES FOR YOUR OVERNIGHT  STAY IF YOU WISH.  YOUR PRESCRIPTION MEDICATIONS WILL BE  PROVIDED DURING [place].                                      DO NOT WEAR JEWERLY, MAKE UP. DO NOT WEAR LOTIONS, POWDERS, PERFUMES OR NAIL POLISH ON YOUR FINGERNAILS. TOENAIL POLISH IS OK TO WEAR. DO NOT SHAVE FOR 48 HOURS PRIOR TO DAY OF SURGERY. MEN MAY SHAVE FACE AND NECK. CONTACTS, GLASSES, OR DENTURES MAY NOT BE WORN TO SURGERY.  REMEMBER: NO SMOKING, DRUGS OR ALCOHOL FOR 24 HOURS BEFORE YOUR SURGERY.                                    [place] IS NOT RESPONSIBLE  FOR ANY BELONGINGS.                                                                    Marland Kitchen           [place] - Preparing for Surgery Before surgery, you can play an important role.  Because skin is not sterile, your skin needs to be as free of germs as possible.  You can reduce the number of germs on your skin by washing with CHG (chlorahexidine gluconate) soap before surgery.  CHG is an antiseptic cleaner which kills germs and bonds with the skin to continue killing germs even after washing. Please DO NOT use if you have an allergy to CHG or antibacterial soaps.  If your skin becomes reddened/irritated stop using the CHG and inform your nurse when you arrive at Short Stay. Do not shave (including legs and underarms) for at least 48 hours prior to the first CHG shower.  You may shave your face/neck. Please follow these instructions carefully:  1.  Shower with CHG Soap the night before surgery and the  morning of Surgery.  2.  If you choose to wash your hair, wash your hair first as usual with your  normal  shampoo.  3.  After you shampoo, rinse your hair and body thoroughly to remove the  shampoo.                                        4.  Use CHG as you would any other liquid soap.  You can apply chg directly  to the skin and wash , chg soap provided, night before and morning of your surgery.  5.  Apply the CHG Soap to your body ONLY FROM THE NECK DOWN.   Do not use on face/ open                           Wound  or open sores. Avoid contact with eyes, ears mouth and genitals (private parts).                       Wash face,  Genitals (private parts) with your normal soap.  6.  Wash thoroughly, paying special attention to the area where your surgery  will be performed.  7.  Thoroughly rinse your body with warm water from the neck down.  8.  DO NOT shower/wash with your normal soap after using and rinsing off  the CHG Soap.             9.  Pat yourself dry with a clean towel.            10.  Wear clean pajamas.            11.  Place clean sheets on your bed the night of your first shower and do not  sleep with pets. Day of Surgery : Do not apply any lotions/deodorants the morning of surgery.  Please wear clean clothes to the hospital/surgery center.  IF YOU HAVE ANY SKIN IRRITATION OR PROBLEMS WITH THE SURGICAL SOAP, PLEASE GET A BAR OF GOLD DIAL SOAP AND SHOWER THE NIGHT BEFORE YOUR SURGERY AND THE MORNING OF YOUR SURGERY. PLEASE LET THE NURSE KNOW MORNING OF YOUR SURGERY IF YOU HAD ANY PROBLEMS WITH THE SURGICAL SOAP.   ________________________________________________________________________                                                        QUESTIONS [place] PRE OP NURSE PHONE [phone].

## 2022-05-19 NOTE — Progress Notes (Signed)
I routed abnormal lab values to Dr. Brien Mates on 05/19/22.

## 2022-05-21 ENCOUNTER — Encounter (HOSPITAL_COMMUNITY): Payer: BC Managed Care – PPO

## 2022-05-21 NOTE — Anesthesia Preprocedure Evaluation (Addendum)
Anesthesia Evaluation  Patient identified by MRN, date of birth, ID band Patient awake    Reviewed: Allergy & Precautions, NPO status , Patient's Chart, lab work & pertinent test results  Airway Mallampati: II  TM Distance: >3 FB Neck ROM: Full    Dental no notable dental hx.    Pulmonary neg pulmonary ROS   Pulmonary exam normal breath sounds clear to auscultation       Cardiovascular Exercise Tolerance: Good Normal cardiovascular exam Rhythm:Regular Rate:Normal     Neuro/Psych negative neurological ROS  negative psych ROS   GI/Hepatic negative GI ROS, Neg liver ROS,,,  Endo/Other    Renal/GU    Uterine leiyomyomata    Musculoskeletal negative musculoskeletal ROS (+)    Abdominal   Peds  Hematology  (+) Blood dyscrasia, anemia Lab Results      Component                Value               Date                      WBC                      6.0                 05/18/2022                HGB                      9.3 (L)             05/18/2022                HCT                      34.3 (L)            05/18/2022                MCV                      66.2 (L)            05/18/2022                PLT                      417 (H)             05/18/2022              Anesthesia Other Findings All: Prednisone  Reproductive/Obstetrics negative OB ROS                             Anesthesia Physical Anesthesia Plan  ASA: 2  Anesthesia Plan: General   Post-op Pain Management: Lidocaine infusion*, Ketamine IV* and Toradol IV (intra-op)*   Induction: Intravenous  PONV Risk Score and Plan: 4 or greater and Treatment may vary due to age or medical condition, Ondansetron, Midazolam and Dexamethasone  Airway Management Planned: Oral ETT  Additional Equipment: None  Intra-op Plan:   Post-operative Plan: Extubation in OR  Informed Consent: I have reviewed the patients History and  Physical, chart, labs and discussed the procedure including the risks, benefits and alternatives for the proposed anesthesia with the patient or authorized representative  who has indicated his/her understanding and acceptance.     Dental advisory given  Plan Discussed with: CRNA and Surgeon  Anesthesia Plan Comments:        Anesthesia Quick Evaluation

## 2022-05-21 NOTE — H&P (Signed)
Hannah [name] is an 36 y.o. female P2 with abnormal uterine bleeding secondary to leiomyoma.   From 6/9 visit:  "Underwent [facility], hysteroscopic myomectomy in January for 8.5 uterine fibroid.  Postop complicated by pelvic infection/sepsis 1 month after procedure  States periods were a bit irregular but not heavy since then until end of May, then had heavy period week of 5/20, then another period started 6/4 and passing clots. She had CBC  6/5 which showed hgb 7.0. feeling fatigued. Has iron infusion set up for Monday. She did OCP taper and now bleeding has stopped."  12/26/21 GYN [place]: 10.3cm anteverted uterus with 6.6x5.5x4.07cm anterior fibroid distorting endometrial cavity, normal ovaries  05/05/22 hgb 6.1 - transfused 2U PRBC 05/18/22 hgb 9.3   Menstrual History: Patient's last menstrual period was 05/06/2022 (approximate).    Past Medical History:  Diagnosis Date   Anemia 05/14/2022   hx of IV iron and blood transfusions, most recent blood transfusion 05/08/22   Fibroid uterus    Pelvic inflammatory disease 09/04/2021   w/ sepsis   Sepsis ([place]) 09/04/2021   5 day hospital admission, r/t PID one month post D & C    Past Surgical History:  Procedure Laterality Date   CESAREAN SECTION  2008   CESAREAN SECTION  04/08/2012   Procedure: CESAREAN SECTION;  Surgeon: Daria Pastures, MD;  Location: [facility];  Service: Obstetrics;  Laterality: N/A;  Repeat Cesarean Section Delivery Boy @ [phone],   DILATATION & CURETTAGE/HYSTEROSCOPY WITH MYOSURE N/A 08/04/2021   Procedure: HYSTEROSCOPIC Jacklynn Barnacle MYOMECTOMY;  Surgeon: Rowland Lathe, MD;  Location: [facility];  Service: Gynecology;  Laterality: N/A;    Family History  Problem Relation Age of Onset   Hypertension Mother    Other Neg Hx     Social History:  reports that she has never smoked. She has never used smokeless tobacco. She reports current alcohol use of about 5.0 standard drinks of alcohol per week.  She reports that she does not use drugs.  Allergies:  Allergies  Allergen Reactions   Prednisone Swelling    Medications Prior to Admission  Medication Sig Dispense Refill Last Dose   Ferrous Sulfate (SLOW FE) 142 (45 Fe) MG TBCR Take 142 mg by mouth daily.   05/21/2022   norgestimate-ethinyl estradiol (ORTHO-CYCLEN) 0.25-35 MG-MCG tablet Take 1 tablet by mouth daily.   05/21/2022   ibuprofen (ADVIL) 800 MG tablet Take 1 tablet (800 mg total) by mouth every 8 (eight) hours as needed. 30 tablet 0 05/15/2022    Review of Systems  Constitutional:  Positive for fatigue. Negative for fever.  HENT:  Negative for sore throat.   Eyes:  Negative for visual disturbance.  Respiratory:  Negative for shortness of breath.   Cardiovascular:  Negative for chest pain.  Gastrointestinal:  Negative for abdominal pain.  Genitourinary:  Positive for menstrual problem.  Musculoskeletal:  Negative for myalgias.  Psychiatric/Behavioral:  Negative for suicidal ideas.     Blood pressure 138/87, pulse 73, temperature 98.4 F (36.9 C), temperature source Oral, resp. rate 16, height '5\' 3"'$  (1.6 m), weight 83.7 kg, last menstrual period 05/06/2022, SpO2 100 %. Physical Exam Chaperone Chaperone: present  Constitutional General Appearance: healthy-appearing, well-nourished, well-developed  Psychiatric Orientation: to time, to place, to person Mood and Affect: active and alert, normal mood, normal affect  Abdomen Auscultation/Inspection/Palpation: normal bowel sounds, no tenderness, no hepatomegaly, no splenomegaly, no CVA tenderness (anterior fibroid) Hernia: none palpated  Female Genitalia Vulva: no masses, no atrophy,  no lesions Bladder/Urethra: normal meatus, no urethral discharge, no urethral mass, bladder non distended Vagina no tenderness, no erythema, no abnormal vaginal discharge, no vesicle(s) or ulcers, no cystocele, no rectocele Cervix: grossly normal, no discharge, no cervical motion  tenderness Uterus: midline, mobile, non-tender, no uterine prolapse (enlarged, anterior fibroid) Adnexa/Parametria: no parametrial tenderness, no parametrial mass, no adnexal tenderness, no ovarian mass Results for orders placed or performed during the hospital encounter of 05/22/22 (from the past 24 hour(s))  Pregnancy, urine POC     Status: None   Collection Time: 05/22/22  5:45 AM  Result Value Ref Range   Preg Test, Ur NEGATIVE NEGATIVE  Type and screen [facility]     Status: None (Preliminary result)   Collection Time: 05/22/22  6:17 AM  Result Value Ref Range   ABO/RH(D) PENDING    Antibody Screen PENDING    Sample Expiration      05/25/2022,2359 Performed at [facility], [place] [address] [place], [place] [address]     No results found.  Assessment/Plan: 36Y P2 with 6.5cm anterior fibroid and abnormal uterine bleeding - Plan: robot assisted laparoscopic myomectomy - Informed consent obtained. Reviewed risk of infection, bleeding, damage to surrounding organs, laparotomy, blood transfusion, failure to achieve desired result. All questions answered.  - Ancef 2g - Plan extended recovery and discharge tomorrow  Rowland Lathe 05/22/2022, 7:19 AM

## 2022-05-22 ENCOUNTER — Ambulatory Visit (HOSPITAL_BASED_OUTPATIENT_CLINIC_OR_DEPARTMENT_OTHER): Payer: BC Managed Care – PPO | Admitting: Anesthesiology

## 2022-05-22 ENCOUNTER — Encounter (HOSPITAL_BASED_OUTPATIENT_CLINIC_OR_DEPARTMENT_OTHER): Payer: Self-pay | Admitting: Obstetrics and Gynecology

## 2022-05-22 ENCOUNTER — Inpatient Hospital Stay (HOSPITAL_BASED_OUTPATIENT_CLINIC_OR_DEPARTMENT_OTHER)
Admission: AD | Admit: 2022-05-22 | Discharge: 2022-05-24 | DRG: 742 | Disposition: A | Discharge status: Home / Self Care | Payer: BC Managed Care – PPO | Attending: Obstetrics and Gynecology | Admitting: Obstetrics and Gynecology

## 2022-05-22 ENCOUNTER — Other Ambulatory Visit: Payer: Self-pay

## 2022-05-22 ENCOUNTER — Ambulatory Visit (HOSPITAL_BASED_OUTPATIENT_CLINIC_OR_DEPARTMENT_OTHER): Payer: BC Managed Care – PPO | Admitting: Emergency Medicine

## 2022-05-22 ENCOUNTER — Encounter (HOSPITAL_COMMUNITY)
Admission: AD | Disposition: A | Discharge status: Home / Self Care | Payer: Self-pay | Source: Home / Self Care | Attending: Obstetrics and Gynecology

## 2022-05-22 DIAGNOSIS — K660 Peritoneal adhesions (postprocedural) (postinfection): Secondary | ICD-10-CM | POA: Diagnosis present

## 2022-05-22 DIAGNOSIS — Z9889 Other specified postprocedural states: Secondary | ICD-10-CM

## 2022-05-22 DIAGNOSIS — Z793 Long term (current) use of hormonal contraceptives: Secondary | ICD-10-CM

## 2022-05-22 DIAGNOSIS — N938 Other specified abnormal uterine and vaginal bleeding: Secondary | ICD-10-CM | POA: Diagnosis not present

## 2022-05-22 DIAGNOSIS — D250 Submucous leiomyoma of uterus: Secondary | ICD-10-CM | POA: Diagnosis present

## 2022-05-22 DIAGNOSIS — Z79899 Other long term (current) drug therapy: Secondary | ICD-10-CM | POA: Diagnosis not present

## 2022-05-22 DIAGNOSIS — D259 Leiomyoma of uterus, unspecified: Secondary | ICD-10-CM | POA: Diagnosis present

## 2022-05-22 DIAGNOSIS — Z5331 Laparoscopic surgical procedure converted to open procedure: Secondary | ICD-10-CM | POA: Diagnosis not present

## 2022-05-22 DIAGNOSIS — D62 Acute posthemorrhagic anemia: Secondary | ICD-10-CM | POA: Diagnosis not present

## 2022-05-22 DIAGNOSIS — Z01818 Encounter for other preprocedural examination: Principal | ICD-10-CM

## 2022-05-22 HISTORY — PX: HYSTEROSCOPY: SHX211

## 2022-05-22 HISTORY — PX: ROBOT ASSISTED MYOMECTOMY: SHX5142

## 2022-05-22 HISTORY — DX: Leiomyoma of uterus, unspecified: D25.9

## 2022-05-22 HISTORY — PX: ROBOTIC ASSISTED LAPAROSCOPIC LYSIS OF ADHESION: SHX6080

## 2022-05-22 HISTORY — PX: MYOMECTOMY: SHX85

## 2022-05-22 LAB — POCT PREGNANCY, URINE: Preg Test, Ur: NEGATIVE

## 2022-05-22 LAB — TYPE AND SCREEN
ABO/RH(D): O POS
Antibody Screen: NEGATIVE

## 2022-05-22 SURGERY — MYOMECTOMY, ROBOT-ASSISTED
Anesthesia: General | Site: Uterus

## 2022-05-22 MED ORDER — DIPHENHYDRAMINE HCL 50 MG/ML IJ SOLN
INTRAMUSCULAR | Status: DC | PRN
  Administered 2022-05-22: 12.5 mg via INTRAVENOUS

## 2022-05-22 MED ORDER — ONDANSETRON HCL 4 MG/2ML IJ SOLN
INTRAMUSCULAR | Status: AC
  Filled 2022-05-22: qty 2

## 2022-05-22 MED ORDER — FENTANYL CITRATE (PF) 250 MCG/5ML IJ SOLN
INTRAMUSCULAR | Status: AC
  Filled 2022-05-22: qty 5

## 2022-05-22 MED ORDER — DEXAMETHASONE SODIUM PHOSPHATE 10 MG/ML IJ SOLN
INTRAMUSCULAR | Status: AC
  Filled 2022-05-22: qty 1

## 2022-05-22 MED ORDER — OXYCODONE HCL 5 MG PO TABS: 5.0000 mg | ORAL_TABLET | Freq: Once | ORAL | Status: DC | PRN

## 2022-05-22 MED ORDER — ACETAMINOPHEN 500 MG PO TABS
ORAL_TABLET | ORAL | Status: AC
  Filled 2022-05-22: qty 2

## 2022-05-22 MED ORDER — LIDOCAINE HCL 2 % IJ SOLN
INTRAMUSCULAR | Status: AC
  Filled 2022-05-22: qty 10

## 2022-05-22 MED ORDER — VASOPRESSIN 20 UNIT/ML IV SOLN
INTRAVENOUS | Status: DC | PRN
  Administered 2022-05-22: 8 mL via INTRAMUSCULAR

## 2022-05-22 MED ORDER — IBUPROFEN 200 MG PO TABS
600.0000 mg | ORAL_TABLET | Freq: Four times a day (QID) | ORAL | Status: DC
  Administered 2022-05-23 – 2022-05-24 (×4): 600 mg via ORAL
  Filled 2022-05-22 (×4): qty 3

## 2022-05-22 MED ORDER — KETAMINE HCL 50 MG/5ML IJ SOSY
PREFILLED_SYRINGE | INTRAMUSCULAR | Status: AC
  Filled 2022-05-22: qty 5

## 2022-05-22 MED ORDER — PROPOFOL 10 MG/ML IV BOLUS
INTRAVENOUS | Status: AC
  Filled 2022-05-22: qty 20

## 2022-05-22 MED ORDER — METHYLERGONOVINE MALEATE 0.2 MG/ML IJ SOLN
INTRAMUSCULAR | Status: DC | PRN
  Administered 2022-05-22: .2 mg via INTRAMUSCULAR

## 2022-05-22 MED ORDER — EPHEDRINE 5 MG/ML INJ
INTRAVENOUS | Status: AC
  Filled 2022-05-22: qty 5

## 2022-05-22 MED ORDER — LIDOCAINE 2% (20 MG/ML) 5 ML SYRINGE
INTRAMUSCULAR | Status: DC | PRN
  Administered 2022-05-22: 100 mg via INTRAVENOUS

## 2022-05-22 MED ORDER — SIMETHICONE 80 MG PO CHEW
80.0000 mg | CHEWABLE_TABLET | Freq: Four times a day (QID) | ORAL | Status: DC | PRN
  Administered 2022-05-22: 80 mg via ORAL
  Filled 2022-05-22 (×2): qty 1

## 2022-05-22 MED ORDER — ONDANSETRON HCL 4 MG/2ML IJ SOLN
4.0000 mg | Freq: Four times a day (QID) | INTRAMUSCULAR | Status: DC | PRN
  Filled 2022-05-22: qty 2

## 2022-05-22 MED ORDER — OXYCODONE HCL 5 MG/5ML PO SOLN: 5.0000 mg | Freq: Once | ORAL | Status: DC | PRN

## 2022-05-22 MED ORDER — HYDROMORPHONE HCL 1 MG/ML IJ SOLN
0.2000 mg | INTRAMUSCULAR | Status: DC | PRN
  Filled 2022-05-22: qty 1

## 2022-05-22 MED ORDER — SUGAMMADEX SODIUM 200 MG/2ML IV SOLN
INTRAVENOUS | Status: DC | PRN
  Administered 2022-05-22: 200 mg via INTRAVENOUS

## 2022-05-22 MED ORDER — HYDROMORPHONE HCL 1 MG/ML IJ SOLN
INTRAMUSCULAR | Status: DC | PRN
  Administered 2022-05-22 (×2): .5 mg via INTRAVENOUS

## 2022-05-22 MED ORDER — EPHEDRINE SULFATE-NACL 50-0.9 MG/10ML-% IV SOSY
PREFILLED_SYRINGE | INTRAVENOUS | Status: DC | PRN
  Administered 2022-05-22: 10 mg via INTRAVENOUS

## 2022-05-22 MED ORDER — HYDROMORPHONE HCL 1 MG/ML IJ SOLN
INTRAMUSCULAR | Status: AC
  Filled 2022-05-22: qty 1

## 2022-05-22 MED ORDER — SODIUM CHLORIDE 0.9 % IR SOLN
Status: DC | PRN
  Administered 2022-05-22: 1

## 2022-05-22 MED ORDER — ONDANSETRON HCL 4 MG PO TABS
4.0000 mg | ORAL_TABLET | Freq: Four times a day (QID) | ORAL | Status: DC | PRN
  Administered 2022-05-24: 4 mg via ORAL
  Filled 2022-05-22: qty 1

## 2022-05-22 MED ORDER — FENTANYL CITRATE (PF) 100 MCG/2ML IJ SOLN
INTRAMUSCULAR | Status: AC
  Filled 2022-05-22: qty 2

## 2022-05-22 MED ORDER — LIDOCAINE HCL (PF) 2 % IJ SOLN
INTRAMUSCULAR | Status: DC | PRN
  Administered 2022-05-22: 1.5 mg/kg/h via INTRADERMAL

## 2022-05-22 MED ORDER — LABETALOL HCL 5 MG/ML IV SOLN
INTRAVENOUS | Status: DC | PRN
  Administered 2022-05-22: 5 mg via INTRAVENOUS

## 2022-05-22 MED ORDER — DOCUSATE SODIUM 100 MG PO CAPS
100.0000 mg | ORAL_CAPSULE | Freq: Two times a day (BID) | ORAL | Status: DC
  Administered 2022-05-22 – 2022-05-24 (×4): 100 mg via ORAL
  Filled 2022-05-22 (×4): qty 1

## 2022-05-22 MED ORDER — LIDOCAINE HCL (PF) 2 % IJ SOLN
INTRAMUSCULAR | Status: AC
  Filled 2022-05-22: qty 5

## 2022-05-22 MED ORDER — POLYETHYLENE GLYCOL 3350 17 G PO PACK: 17.0000 g | PACK | Freq: Every day | ORAL | Status: DC | PRN

## 2022-05-22 MED ORDER — ONDANSETRON HCL 4 MG/2ML IJ SOLN: 4.0000 mg | Freq: Once | INTRAMUSCULAR | Status: DC | PRN

## 2022-05-22 MED ORDER — DIPHENHYDRAMINE HCL 50 MG/ML IJ SOLN
INTRAMUSCULAR | Status: AC
  Filled 2022-05-22: qty 1

## 2022-05-22 MED ORDER — LABETALOL HCL 5 MG/ML IV SOLN
INTRAVENOUS | Status: AC
  Filled 2022-05-22: qty 4

## 2022-05-22 MED ORDER — TRANEXAMIC ACID-NACL 1000-0.7 MG/100ML-% IV SOLN
INTRAVENOUS | Status: AC
  Filled 2022-05-22: qty 100

## 2022-05-22 MED ORDER — ARTIFICIAL TEARS OPHTHALMIC OINT
TOPICAL_OINTMENT | OPHTHALMIC | Status: AC
  Filled 2022-05-22: qty 3.5

## 2022-05-22 MED ORDER — LACTATED RINGERS IV SOLN: INTRAVENOUS | Status: DC

## 2022-05-22 MED ORDER — KETOROLAC TROMETHAMINE 30 MG/ML IJ SOLN
INTRAMUSCULAR | Status: AC
  Filled 2022-05-22: qty 1

## 2022-05-22 MED ORDER — ACETAMINOPHEN 500 MG PO TABS
1000.0000 mg | ORAL_TABLET | Freq: Four times a day (QID) | ORAL | Status: DC
  Administered 2022-05-22 – 2022-05-24 (×7): 1000 mg via ORAL
  Filled 2022-05-22 (×8): qty 2

## 2022-05-22 MED ORDER — OXYCODONE HCL 5 MG PO TABS
5.0000 mg | ORAL_TABLET | ORAL | Status: DC | PRN
  Filled 2022-05-22: qty 1

## 2022-05-22 MED ORDER — CEFAZOLIN SODIUM-DEXTROSE 2-4 GM/100ML-% IV SOLN
2.0000 g | INTRAVENOUS | Status: AC
  Administered 2022-05-22: 2 g via INTRAVENOUS

## 2022-05-22 MED ORDER — ROCURONIUM BROMIDE 10 MG/ML (PF) SYRINGE
PREFILLED_SYRINGE | INTRAVENOUS | Status: DC | PRN
  Administered 2022-05-22: 10 mg via INTRAVENOUS
  Administered 2022-05-22: 60 mg via INTRAVENOUS
  Administered 2022-05-22: 20 mg via INTRAVENOUS

## 2022-05-22 MED ORDER — SILVER NITRATE-POT NITRATE 75-25 % EX MISC
CUTANEOUS | Status: DC | PRN
  Administered 2022-05-22: 4

## 2022-05-22 MED ORDER — HYDROMORPHONE HCL 2 MG/ML IJ SOLN
INTRAMUSCULAR | Status: AC
  Filled 2022-05-22: qty 1

## 2022-05-22 MED ORDER — MIDAZOLAM HCL 2 MG/2ML IJ SOLN
INTRAMUSCULAR | Status: DC | PRN
  Administered 2022-05-22: 2 mg via INTRAVENOUS

## 2022-05-22 MED ORDER — CEFAZOLIN SODIUM-DEXTROSE 2-4 GM/100ML-% IV SOLN
INTRAVENOUS | Status: AC
  Filled 2022-05-22: qty 100

## 2022-05-22 MED ORDER — MIDAZOLAM HCL 2 MG/2ML IJ SOLN
INTRAMUSCULAR | Status: AC
  Filled 2022-05-22: qty 2

## 2022-05-22 MED ORDER — LACTATED RINGERS IV SOLN
INTRAVENOUS | Status: DC
Start: 2022-05-22 — End: 2022-05-22

## 2022-05-22 MED ORDER — KETOROLAC TROMETHAMINE 30 MG/ML IJ SOLN
30.0000 mg | Freq: Four times a day (QID) | INTRAMUSCULAR | Status: AC
  Administered 2022-05-22 – 2022-05-23 (×4): 30 mg via INTRAVENOUS
  Filled 2022-05-22 (×4): qty 1

## 2022-05-22 MED ORDER — MENTHOL 3 MG MT LOZG: 1.0000 | LOZENGE | OROMUCOSAL | Status: DC | PRN

## 2022-05-22 MED ORDER — KETAMINE HCL 10 MG/ML IJ SOLN
INTRAMUSCULAR | Status: DC | PRN
  Administered 2022-05-22: 10 mg via INTRAVENOUS
  Administered 2022-05-22: 30 mg via INTRAVENOUS

## 2022-05-22 MED ORDER — KETOROLAC TROMETHAMINE 30 MG/ML IJ SOLN: 30.0000 mg | Freq: Once | INTRAMUSCULAR | Status: DC | PRN

## 2022-05-22 MED ORDER — TRANEXAMIC ACID-NACL 1000-0.7 MG/100ML-% IV SOLN
INTRAVENOUS | Status: DC | PRN
  Administered 2022-05-22: 1000 mg via INTRAVENOUS

## 2022-05-22 MED ORDER — ACETAMINOPHEN 500 MG PO TABS
1000.0000 mg | ORAL_TABLET | ORAL | Status: AC
  Administered 2022-05-22: 1000 mg via ORAL

## 2022-05-22 MED ORDER — HYDROMORPHONE HCL 1 MG/ML IJ SOLN
0.2500 mg | INTRAMUSCULAR | Status: DC | PRN
  Administered 2022-05-22 (×2): 0.5 mg via INTRAVENOUS

## 2022-05-22 MED ORDER — ONDANSETRON HCL 4 MG/2ML IJ SOLN
INTRAMUSCULAR | Status: DC | PRN
  Administered 2022-05-22: 4 mg via INTRAVENOUS

## 2022-05-22 MED ORDER — POVIDONE-IODINE 10 % EX SWAB: 2.0000 | Freq: Once | CUTANEOUS | Status: DC

## 2022-05-22 MED ORDER — KETOROLAC TROMETHAMINE 30 MG/ML IJ SOLN
INTRAMUSCULAR | Status: DC | PRN
  Administered 2022-05-22: 30 mg via INTRAVENOUS

## 2022-05-22 MED ORDER — ROCURONIUM BROMIDE 10 MG/ML (PF) SYRINGE
PREFILLED_SYRINGE | INTRAVENOUS | Status: AC
  Filled 2022-05-22: qty 10

## 2022-05-22 MED ORDER — FENTANYL CITRATE (PF) 100 MCG/2ML IJ SOLN
INTRAMUSCULAR | Status: DC | PRN
  Administered 2022-05-22: 100 ug via INTRAVENOUS
  Administered 2022-05-22: 50 ug via INTRAVENOUS
  Administered 2022-05-22: 100 ug via INTRAVENOUS
  Administered 2022-05-22 (×2): 50 ug via INTRAVENOUS

## 2022-05-22 MED ORDER — PROPOFOL 10 MG/ML IV BOLUS
INTRAVENOUS | Status: DC | PRN
  Administered 2022-05-22: 180 mg via INTRAVENOUS
  Administered 2022-05-22 (×2): 20 mg via INTRAVENOUS

## 2022-05-22 SURGICAL SUPPLY — 68 items
ADH SKN CLS APL DERMABOND .7 (GAUZE/BANDAGES/DRESSINGS) ×3
AGENT HMST KT MTR STRL THRMB (HEMOSTASIS) ×3
APL SKNCLS STERI-STRIP NONHPOA (GAUZE/BANDAGES/DRESSINGS) ×3
BARRIER ADHS 3X4 INTERCEED (GAUZE/BANDAGES/DRESSINGS) IMPLANT
BENZOIN TINCTURE PRP APPL 2/3 (GAUZE/BANDAGES/DRESSINGS) IMPLANT
BRR ADH 4X3 ABS CNTRL BYND (GAUZE/BANDAGES/DRESSINGS) ×6
COVER BACK TABLE 60X90IN (DRAPES) ×3 IMPLANT
COVER TIP SHEARS 8 DVNC (MISCELLANEOUS) ×3 IMPLANT
COVER TIP SHEARS 8MM DA VINCI (MISCELLANEOUS) ×3
DEFOGGER SCOPE WARMER CLEARIFY (MISCELLANEOUS) ×3 IMPLANT
DERMABOND ADVANCED .7 DNX12 (GAUZE/BANDAGES/DRESSINGS) ×3 IMPLANT
DRAPE ARM DVNC X/XI (DISPOSABLE) ×12 IMPLANT
DRAPE COLUMN DVNC XI (DISPOSABLE) ×3 IMPLANT
DRAPE DA VINCI XI ARM (DISPOSABLE) ×12
DRAPE DA VINCI XI COLUMN (DISPOSABLE) ×3
DRAPE UTILITY XL STRL (DRAPES) ×3 IMPLANT
DRSG OPSITE POSTOP 4X10 (GAUZE/BANDAGES/DRESSINGS) IMPLANT
DURAPREP 26ML APPLICATOR (WOUND CARE) ×3 IMPLANT
ELECT REM PT RETURN 9FT ADLT (ELECTROSURGICAL) ×3
ELECTRODE REM PT RTRN 9FT ADLT (ELECTROSURGICAL) ×3 IMPLANT
GAUZE PAD ABD 8X10 STRL (GAUZE/BANDAGES/DRESSINGS) IMPLANT
GAUZE SPONGE 4X4 12PLY STRL (GAUZE/BANDAGES/DRESSINGS) IMPLANT
GLOVE BIO SURGEON STRL SZ 6 (GLOVE) ×6 IMPLANT
GLOVE BIO SURGEON STRL SZ7 (GLOVE) ×12 IMPLANT
GLOVE BIOGEL PI IND STRL 6.5 (GLOVE) ×9 IMPLANT
GOWN STRL REUS W/TWL LRG LVL3 (GOWN DISPOSABLE) IMPLANT
HOLDER FOLEY CATH W/STRAP (MISCELLANEOUS) IMPLANT
IRRIG SUCT STRYKERFLOW 2 WTIP (MISCELLANEOUS) ×3
IRRIGATION SUCT STRKRFLW 2 WTP (MISCELLANEOUS) ×3 IMPLANT
KIT PROCEDURE FLUENT (KITS) IMPLANT
KIT TURNOVER CYSTO (KITS) ×3 IMPLANT
LEGGING LITHOTOMY PAIR STRL (DRAPES) ×3 IMPLANT
NDL INSUFFLATION 14GA 120MM (NEEDLE) ×3 IMPLANT
NDL SPNL 22GX7 QUINCKE BK (NEEDLE) IMPLANT
NEEDLE INSUFFLATION 14GA 120MM (NEEDLE) ×3 IMPLANT
NEEDLE SPNL 22GX7 QUINCKE BK (NEEDLE) ×3 IMPLANT
OBTURATOR OPTICAL STANDARD 8MM (TROCAR) ×3
OBTURATOR OPTICAL STND 8 DVNC (TROCAR) ×3
OBTURATOR OPTICALSTD 8 DVNC (TROCAR) ×3 IMPLANT
PACK ROBOT WH (CUSTOM PROCEDURE TRAY) ×3 IMPLANT
PACK ROBOTIC GOWN (GOWN DISPOSABLE) ×3 IMPLANT
PACK TRENDGUARD 450 HYBRID PRO (MISCELLANEOUS) IMPLANT
PACK VAGINAL MINOR WOMEN LF (CUSTOM PROCEDURE TRAY) IMPLANT
PAD OB MATERNITY 4.3X12.25 (PERSONAL CARE ITEMS) ×3 IMPLANT
PAD PREP 24X48 CUFFED NSTRL (MISCELLANEOUS) ×3 IMPLANT
PORT ACCESS TROCAR AIRSEAL 12 (TROCAR) IMPLANT
PORT ACCESS TROCAR AIRSEAL 5M (TROCAR) ×3
RTRCTR C-SECT PINK 25CM LRG (MISCELLANEOUS) IMPLANT
SEAL CANN UNIV 5-8 DVNC XI (MISCELLANEOUS) ×9 IMPLANT
SEAL XI 5MM-8MM UNIVERSAL (MISCELLANEOUS) ×9
SET TRI-LUMEN FLTR TB AIRSEAL (TUBING) ×3 IMPLANT
SPIKE FLUID TRANSFER (MISCELLANEOUS) ×3 IMPLANT
STRIP CLOSURE SKIN 1/2X4 (GAUZE/BANDAGES/DRESSINGS) IMPLANT
SURGIFLO W/THROMBIN 8M KIT (HEMOSTASIS) IMPLANT
SUT PLAIN 2 0 XLH (SUTURE) IMPLANT
SUT VIC AB 0 CT1 27 (SUTURE) ×18
SUT VIC AB 0 CT1 27XBRD ANBCTR (SUTURE) IMPLANT
SUT VIC AB 0 CT1 36 (SUTURE) ×3 IMPLANT
SUT VIC AB 2-0 CT1 (SUTURE) IMPLANT
SUT VIC AB 4-0 KS 27 (SUTURE) IMPLANT
SUT VICRYL 0 UR6 27IN ABS (SUTURE) IMPLANT
SUT VICRYL RAPIDE 3 0 (SUTURE) ×6 IMPLANT
SUT VLOC 180 0 9IN  GS21 (SUTURE) ×3
SUT VLOC 180 0 9IN GS21 (SUTURE) ×3 IMPLANT
TOWEL OR 17X26 10 PK STRL BLUE (TOWEL DISPOSABLE) ×3 IMPLANT
TRAY FOLEY W/BAG SLVR 14FR LF (SET/KITS/TRAYS/PACK) ×3 IMPLANT
TRENDGUARD 450 HYBRID PRO PACK (MISCELLANEOUS) ×3
WATER STERILE IRR 500ML POUR (IV SOLUTION) IMPLANT

## 2022-05-22 NOTE — Progress Notes (Signed)
GYN Progress Note  S: Doing well. Pain ranging from 0-5, controlled with pain regimen. Decreased appetite but tolerated salmon for dinner without vomiting/nausea. Not yet ambulating or voiding, foley in place. Hasn't looked at pad   O: Today's Vitals   05/22/22 1700 05/22/22 1724 05/22/22 1725 05/22/22 2002  BP: 115/70  122/77 132/77  Pulse: 86  68 86  Resp: '19  16 18  '$ Temp: 97.8 F (36.6 C)  98.9 F (37.2 C) 97.6 F (36.4 C)  TempSrc:   Oral Oral  SpO2: 98%  99% 100%  Weight:      Height:      PainSc: 4  5      Body mass index is 32.7 kg/m.  General: no acute distress, sitting up in bed Abd: soft, minimally distended, appropriately tender to palpation Incisions: 4 lap port site incisions clean dry and intact with skin glue, pressure dressing over pfannenstiel (clean/dry) Ext: no calf edema or tenderness, SCDs on and cycling Perineum: pad in place with 3cm dark blood stain GU: foley in place draining clear yellow urine  CBC    Component Value Date/Time   WBC 6.0 05/18/2022 1318   RBC 5.18 (H) 05/18/2022 1318   HGB 9.3 (L) 05/18/2022 1318   HCT 34.3 (L) 05/18/2022 1318   PLT 417 (H) 05/18/2022 1318   MCV 66.2 (L) 05/18/2022 1318   MCH 18.0 (L) 05/18/2022 1318   MCHC 27.1 (L) 05/18/2022 1318   RDW 25.3 (H) 05/18/2022 1318   LYMPHSABS 0.7 05/18/2022 1318   MONOABS 0.3 05/18/2022 1318   EOSABS 0.1 05/18/2022 1318   BASOSABS 0.0 05/18/2022 1318    A/P: POD#0 sp robot assisted laparoscopic lysis of adhesions, converted to abdominal myomectomy, diagnostic hysteroscopy - Reviewed procedure in detail with patient - Doing well, pain controlled - Okay to d/c foley when able to ambulate - Vaginal bleeding as expected postop, continue to monitor - AM CBC ordered - Dispo: anticipate discharge POD#2  M. Brien Mates, MD 05/22/22 8:24 PM

## 2022-05-22 NOTE — Progress Notes (Signed)
Patient has tolerated sips of apple juice.

## 2022-05-22 NOTE — Progress Notes (Signed)
Patient transferred to room 1504.  Report called to T.Rip Harbour, RN

## 2022-05-22 NOTE — Op Note (Signed)
05/22/2022   11:16 AM   PATIENT:  Hannah [name]  36 y.o. female   PRE-OPERATIVE DIAGNOSIS:  symptomatic uterine leiomyoma   POST-OPERATIVE DIAGNOSIS:  symptomatic uterine leiomyoma, adhesive disease, post-procedure uterine bleeding   PROCEDURE:  Procedure(s): ATTEMPTED XI ROBOTIC ASSISTED LAPAROSCOPIC MYOMECTOMY (N/A) XI ROBOTIC ASSISTED LAPAROSCOPIC LYSIS OF ADHESIONS ABDOMINAL MYOMECTOMY DIAGNOSTIC HYSTEROSCOPY   SURGEON:  Surgeon(s) and Role:    * Rowland Lathe, MD - Primary    * Bobbye Charleston, MD - Assisting   ANESTHESIA:   general   EBL:  300 mL    BLOOD ADMINISTERED:none   DRAINS: Urinary Catheter (Foley)    LOCAL MEDICATIONS USED:  OTHER Ropivacaine   SPECIMEN:  Source of Specimen:  uterine leiomyoma   DISPOSITION OF SPECIMEN:  PATHOLOGY   COUNTS:  YES   DICTATION: .Note written in EPIC   PLAN OF CARE: Admit to inpatient    PATIENT DISPOSITION:  PACU - hemodynamically stable.   Delay start of Pharmacological VTE agent (>24hrs) due to surgical blood loss or risk of bleeding: not applicable  COMPLICATIONS: extensive adhesions; difficulty accessing the leiomyoma laparoscopically therefore decision made to convert to abdominal myomectomy  FINDINGS:  On bimanual exam, enlarged uterus with palpable anterior leiomyoma On laparoscopic view, extensive adhesions noted, omentum to anterior abdominal wall extending to the umbilicus and anterior uterus to the anterior pelvic wall. Dense bladder adhesions. Uterus enlarged. Normal appearing fallopian tubes and ovaries bilaterally.  After laparotomy, 8cm leiomyoma palpable posteriorly, primarily submucosal with anterior stalk appreciated.  On hysteroscopic view, hysterotomies intact and hemostatic.    DESCRIPTION OF PROCEDURE: After consent was verified the patient was taken to the operating room.  After adequate anesthesia was achieved the patient was positioned in the dorsal lithotomy position with legs in  stirrups. SCDs were in place and cycling.  Exam ad above. The patient was prepped and draped in normal sterile fashion. Ancef 2g was given for infection prophylaxis. A speculum was placed in the vagina and the anterior lip of the cervix was grasped with a tenaculum. A hulk manipulator was placed.The speculum was removed and the bladder was drained with a Foley catheter.   Attention was turned to the abdomen. An 66m incision was made at Palpmer's point and the Veress needle was inserted through the incision. Correct placement was confirmed by opening pressure of 333mg. The abdomen was insufflated with CO2 gas to a total pressure of 1582m. A 36m26mrseal port was inserted under direct laparoscopic visualization. The laparoscope was inserted and confirmed no vascular or visceral trauma from entry. Survey of the abdomen revealed adhesions as noted above. The patient was placed in steep Trendelenburg. An 8 mm robotic trochar was inserted into the abdominal cavity superior to the umbilicus.  Two additional 8mm 70motic trochars were placed, one on each side of the abdomen. All trochars were inserted under direct visualization of the camera.  The robot was docked.  The fenestrated bipolar was placed on arm 1 and the monopolar scissors on arm 3 and introduced under direct visualization of the camera.   Survey of the abdomen revealed findings as noted above. The omental adhesions to the anterior abdominal wall were lysed. The anterior uterus was carefully dissected off the anterior pelvic wall, careful to avoid injuring the bladder. Dense bladder adhesions appreciated. A 3 cm anterior fundal incision was made vertically at expected location of fibroid after infiltration with vasopressin, yet fibroid was not encountered. It was difficult to assess best location to access  fibroid. There was a lower posterior uterine bulge noted but this was not expected location of fibroid. Decision was made to convert to laparotomy to  determine best access to fibroid.   The robotic port site incisions were closed with 3-0 vicryl. The 40m port site fascia was closed with 0-vicryl and skin closed with 3-0 vicryl. Surgical glue was applied.   A low transverse skin incision was made with a scalpel and carried down to the level of the fascia.  The fascia was incised in the midline with the scalpel and extended laterally with curved Mayo scissors.  Kocher clamps were applied to the inferior fascial edge and the fascia was dissected off the rectus muscle sharply using the Mayo scissors.  The Kocher clamps were transferred to the superior fascial edge and the underlying rectus muscle was dissected off with the scalpel.  The rectus muscles then were separated in the midline.  The peritoneum was found free of adherent bowel and the peritoneal cavity was entered bluntly.  The uterus was identified and the alexis retractor was placed intraperitoneal.    Manual examination of the uterus determined that the fibroid could be best palpated from the posterior lower uterine segment. A 3cm vertical incision was made over the posterior uterine segment and the fibroid was encountered. The ENDOMETRIAL CAVITY WAS ENTERED. The 8cm type 1 fibroid was dissected from the uterus and ligated at its anterior stalk. An incidental 2cm hysterotomy was made anteriorly at the location of the stalk. The specimen was sent for pathology.   The posterior hysterotomy and 2 anterior hysterotomies were closed in 3 layers with 0 vicryl. Hemostasis was appreciated. The bladder was backfilled to determine no bladder injury from earlier adhesion dissection. Interceed and arista were applied to the uterus.  The peritoneum and rectus muscle were reapproximated with 2-0 vicryl, injecting ropivacaine into peritoneal cavity before completing closure. The fascia was closed with a 0 Vicryl suture in a continuous running fashion.  The subcutaneous tissue was irrigated and rendered  hemostatic with cautery.  The subcutaneous layer was subsequently closed with 2-0 Vicryl in a continuous running fashion.  The skin was closed with 4-0 vicryl  in a running subcuticular fashion.  Sponge, lap and needle counts were correct. Steri strips and a Honeycomb dressing were placed on the incision and a pressure dressing was applied.  Attention was turned to the vagina and Hulka removed. Persistent slow oozing of bright red blood was noted from the cervical os. Methergine and TXA given.  A diagnostic hysteroscopy was done to check hysterotomy sites and fibroid stalk for hemostasis. All sites appeared intact and hemostatic. A small amount of clot was noted and cleared out with a gentle curettage. The hysteroscope was reintroduced and hemostasis confirmed. Hysteroscope removed and uterine bleeding had appeared to stop. Surgiflo inserted through the cervix to aid in hemostasis.   MRowland Lathe11/03/23  11:53 AM

## 2022-05-22 NOTE — Brief Op Note (Signed)
05/22/2022  11:16 AM  PATIENT:  Hannah [name]  36 y.o. female  PRE-OPERATIVE DIAGNOSIS:  symptomatic uterine leiomyoma  POST-OPERATIVE DIAGNOSIS:  uterine leiomyoma, adhesive disease, post-procedure uterine bleeding  PROCEDURE:  Procedure(s): ATTEMPTED XI ROBOTIC ASSISTED LAPAROSCOPIC MYOMECTOMY (N/A) XI ROBOTIC ASSISTED LAPAROSCOPIC LYSIS OF ADHESIONS ABDOMINAL MYOMECTOMY DIAGNOSTIC HYSTEROSCOPY  SURGEON:  Surgeon(s) and Role:    * Rowland Lathe, MD - Primary    * Bobbye Charleston, MD - Assisting   ANESTHESIA:   general  EBL:  300 mL   BLOOD ADMINISTERED:none  DRAINS: Urinary Catheter (Foley)   LOCAL MEDICATIONS USED:  OTHER Ropivacaine  SPECIMEN:  Source of Specimen:  uterine leiomyoma  DISPOSITION OF SPECIMEN:  PATHOLOGY  COUNTS:  YES  TOURNIQUET:  * No tourniquets in log *  DICTATION: .Note written in EPIC  PLAN OF CARE: Admit to inpatient   PATIENT DISPOSITION:  PACU - hemodynamically stable.   Delay start of Pharmacological VTE agent (>24hrs) due to surgical blood loss or risk of bleeding: not applicable

## 2022-05-22 NOTE — Anesthesia Procedure Notes (Signed)
Procedure Name: Intubation Date/Time: 05/22/2022 7:36 AM  Performed by: Mechele Claude, CRNAPre-anesthesia Checklist: Patient identified, Emergency Drugs available, Suction available and Patient being monitored Patient Re-evaluated:Patient Re-evaluated prior to induction Oxygen Delivery Method: Circle system utilized Preoxygenation: Pre-oxygenation with 100% oxygen Induction Type: IV induction and Cricoid Pressure applied Ventilation: Mask ventilation without difficulty Laryngoscope Size: Mac and 3 Grade View: Grade II Tube type: Oral Tube size: 7.0 mm Number of attempts: 1 Airway Equipment and Method: Stylet and Oral airway Placement Confirmation: ETT inserted through vocal cords under direct vision, positive ETCO2 and breath sounds checked- equal and bilateral Secured at: 22 cm Tube secured with: Tape Dental Injury: Teeth and Oropharynx as per pre-operative assessment

## 2022-05-22 NOTE — Transfer of Care (Signed)
Immediate Anesthesia Transfer of Care Note  Patient: Hannah [name]  Procedure(s) Performed: Procedure(s) (LRB): ATTEMPTED XI ROBOTIC ASSISTED LAPAROSCOPIC MYOMECTOMY (N/A) XI ROBOTIC ASSISTED LAPAROSCOPIC LYSIS OF ADHESIONS ABDOMINAL MYOMECTOMY DIAGNOSTIC HYSTEROSCOPY  Patient Location: PACU  Anesthesia Type: General  Level of Consciousness: awake and drowsy  Airway & Oxygen Therapy: Patient Spontanous Breathing and Patient connected to face mask oxygen, oral airway removed.  Post-op Assessment: Report given to PACU RN and Post -op Vital signs reviewed and stable  Post vital signs: Reviewed and stable  Complications: No apparent anesthesia complications  Last Vitals:  Vitals Value Taken Time  BP 127/51 05/22/22 1130  Temp    Pulse 71 05/22/22 1132  Resp 15 05/22/22 1132  SpO2 100 % 05/22/22 1132  Vitals shown include unvalidated device data.  Last Pain:  Vitals:   05/22/22 0604  TempSrc: Oral  PainSc: 0-No pain      Patients Stated Pain Goal: 6 (68/03/21 2248)  Complications: No notable events documented.

## 2022-05-22 NOTE — Anesthesia Postprocedure Evaluation (Signed)
Anesthesia Post Note  Patient: Hannah [name]  Procedure(s) Performed: ATTEMPTED XI ROBOTIC ASSISTED LAPAROSCOPIC MYOMECTOMY (Abdomen) XI ROBOTIC ASSISTED LAPAROSCOPIC LYSIS OF ADHESIONS (Abdomen) ABDOMINAL MYOMECTOMY (Abdomen) DIAGNOSTIC HYSTEROSCOPY (Uterus)     Patient location during evaluation: PACU Anesthesia Type: General Level of consciousness: awake and alert Pain management: pain level controlled Vital Signs Assessment: post-procedure vital signs reviewed and stable Respiratory status: spontaneous breathing, nonlabored ventilation, respiratory function stable and patient connected to nasal cannula oxygen Cardiovascular status: blood pressure returned to baseline and stable Postop Assessment: no apparent nausea or vomiting Anesthetic complications: no  No notable events documented.  Last Vitals:  Vitals:   05/22/22 1330 05/22/22 1345  BP: 131/85 133/88  Pulse: 82 86  Resp: 17 17  Temp:    SpO2: 98% 98%    Last Pain:  Vitals:   05/22/22 1345  TempSrc:   PainSc: [place]

## 2022-05-23 LAB — CBC
HCT: 25.7 % — ABNORMAL LOW (ref 36.0–46.0)
Hemoglobin: 7.1 g/dL — ABNORMAL LOW (ref 12.0–15.0)
MCH: 18.4 pg — ABNORMAL LOW (ref 26.0–34.0)
MCHC: 27.6 g/dL — ABNORMAL LOW (ref 30.0–36.0)
MCV: 66.6 fL — ABNORMAL LOW (ref 80.0–100.0)
Platelets: 309 10*3/uL (ref 150–400)
RBC: 3.86 MIL/uL — ABNORMAL LOW (ref 3.87–5.11)
RDW: 25.2 % — ABNORMAL HIGH (ref 11.5–15.5)
WBC: 10.2 10*3/uL (ref 4.0–10.5)
nRBC: 0 % (ref 0.0–0.2)

## 2022-05-23 MED ORDER — SODIUM CHLORIDE 0.9 % IV SOLN
500.0000 mg | Freq: Once | INTRAVENOUS | Status: AC
  Administered 2022-05-23: 500 mg via INTRAVENOUS
  Filled 2022-05-23: qty 25

## 2022-05-23 NOTE — Progress Notes (Addendum)
GYN Progress Note  S: Doing well, pain controlled. Ambulated to bathroom with assistance and voided. Passed flatus. Tolerating PO. Small amount of blood on pad today. No chest pain, SOB, calf pain.    O: Today's Vitals   05/22/22 2215 05/22/22 2300 05/23/22 0003 05/23/22 0438  BP:   (!) 119/59 127/74  Pulse:   (!) 102 100  Resp:   18 16  Temp:   98.4 F (36.9 C) 98.3 F (36.8 C)  TempSrc:   Oral Oral  SpO2:   100% 100%  Weight:      Height:      PainSc: 8  Asleep     Body mass index is 32.7 kg/m.  General: no acute distress, resting in bed CVS: regular rate and rhythm Lungs: clear to auscultation bilaterally Abd: soft, minimally distended, appropriately tender to palpation.  Incisions: 4 lap port site incisions clean dry and intact with skin glue Ext: no calf edema or tenderness, SCDs on and cycling  CBC    Component Value Date/Time   WBC 6.0 05/18/2022 1318   RBC 5.18 (H) 05/18/2022 1318   HGB 9.3 (L) 05/18/2022 1318   HCT 34.3 (L) 05/18/2022 1318   PLT 417 (H) 05/18/2022 1318   MCV 66.2 (L) 05/18/2022 1318   MCH 18.0 (L) 05/18/2022 1318   MCHC 27.1 (L) 05/18/2022 1318   RDW 25.3 (H) 05/18/2022 1318   LYMPHSABS 0.7 05/18/2022 1318   MONOABS 0.3 05/18/2022 1318   EOSABS 0.1 05/18/2022 1318   BASOSABS 0.0 05/18/2022 1318    A/P: POD#1 sp robot assisted laparoscopic lysis of adhesions, converted to abdominal myomectomy, diagnostic hysteroscopy - Doing well, pain controlled - Encouraged ambulation - Vaginal bleeding as expected postop, continue to monitor - Continue home OCP - AM CBC drawn and pending - Dispo: anticipate discharge POD#2  M. Brien Mates, MD 05/23/22 12:12 PM

## 2022-05-23 NOTE — Plan of Care (Signed)

## 2022-05-23 NOTE — Progress Notes (Signed)
Mobility Specialist - Progress Note   05/23/22 1239  Mobility  Activity Ambulated with assistance in hallway  Level of Assistance Modified independent, requires aide device or extra time  Assistive Device Front wheel walker  Distance Ambulated (ft) 120 ft  Activity Response Tolerated well  Mobility Referral Yes  $Mobility charge 1 Mobility   Pt received in chair and agreed to mobility, no c/o pain nor discomfort. Pt back to chair with all needs met.  Roderick Pee Mobility Specialist

## 2022-05-24 MED ORDER — ONDANSETRON HCL 4 MG PO TABS: 4.0000 mg | ORAL_TABLET | Freq: Four times a day (QID) | ORAL | 0 refills | Status: AC | PRN

## 2022-05-24 NOTE — Progress Notes (Signed)
GYN Progress Note  S: Doing well. Pain controlled. Ambulating, voiding, tolerating PO. Passing flatus. Minimal bleeding. No chest pain, shortness of breath, calf pain. Afebrile.    O: Today's Vitals   05/23/22 1528 05/23/22 1952 05/23/22 2040 05/24/22 0619  BP: 121/71 126/70  (!) 140/89  Pulse: (!) 107 (!) 106  (!) 110  Resp: '18 19  18  '$ Temp: 98.6 F (37 C) 98.4 F (36.9 C)  98.6 F (37 C)  TempSrc: Oral Oral  Oral  SpO2: 100% 100%  100%  Weight:      Height:      PainSc:   0-No pain    Body mass index is 32.7 kg/m.  General: no acute distress, resting in bed CVS: regular rate and rhythm Lungs: clear to auscultation bilaterally Abd: soft, minimally distended, appropriately tender to palpation Incisions: 4 lap port site incisions clean dry and intact with skin glue. Pressure dressing removed, pfannenstiel incision clean/dry/intact with Honeycomb dressing.  Ext: no calf edema or tenderness, SCDs on and cycling  CBC    Component Value Date/Time   WBC 10.2 05/23/2022 1636   RBC 3.86 (L) 05/23/2022 1636   HGB 7.1 (L) 05/23/2022 1636   HCT 25.7 (L) 05/23/2022 1636   PLT 309 05/23/2022 1636   MCV 66.6 (L) 05/23/2022 1636   MCH 18.4 (L) 05/23/2022 1636   MCHC 27.6 (L) 05/23/2022 1636   RDW 25.2 (H) 05/23/2022 1636   LYMPHSABS 0.7 05/18/2022 1318   MONOABS 0.3 05/18/2022 1318   EOSABS 0.1 05/18/2022 1318   BASOSABS 0.0 05/18/2022 1318    A/P: POD#2 sp robot assisted laparoscopic lysis of adhesions, converted to abdominal myomectomy, diagnostic hysteroscopy - Chronic iron deficiency anemia + acute blood loss anemia: hgb 7.1 from 9.3 preop. IV venofer given last night. Continue home PO iron. - Meeting postop milestones. Stable for discharge home. Discharge instructions reviewed   M. Brien Mates, MD  05/24/22 7:54 AM

## 2022-05-24 NOTE — Discharge Summary (Signed)
Physician Discharge Summary  Patient ID: Hannah [name] MRN: [id number] DOB/AGE: [birth year] 35 y.o.  Admit date: 05/22/2022 Discharge date: 05/24/2022  Admission Diagnoses: Symptomatic leiomyoma, anemia  Discharge Diagnoses:  Principal Problem:   H/O myomectomy Anemia  Discharged Condition: good  Hospital Course:  11/3 Patient admitted for scheduled surgery. Underwent robot assisted laparoscopic lysis of adhesions, converted to abdominal myomectomy, diagnostic hysteroscopy.  11/4 POD#1 foley catheter removed. Postop hgb 7.1, given IV iron infusion. Ambulating, voiding, tolerating PO 11/5 POD#2 meeting all postop milestones. Stable for discharge.   Consults: None  Significant Diagnostic Studies: labs:     Latest Ref Rng & Units 05/23/2022    4:36 PM 05/18/2022    1:18 PM 09/12/2021    1:23 AM  CBC  WBC 4.0 - 10.5 K/uL 10.2  6.0  9.9   Hemoglobin 12.0 - 15.0 g/dL 7.1  9.3  7.1   Hematocrit 36.0 - 46.0 % 25.7  34.3  23.9   Platelets 150 - 400 K/uL 309  417  321      Treatments: surgery: as above  Discharge Exam: Blood pressure (!) 140/89, pulse (!) 110, temperature 98.6 F (37 C), temperature source Oral, resp. rate 18, height '5\' 3"'$  (1.6 m), weight 83.7 kg, last menstrual period 05/06/2022, SpO2 100 %. General: no acute distress, resting in bed CVS: regular rate and rhythm Lungs: clear to auscultation bilaterally Abd: soft, minimally distended, appropriately tender to palpation Incisions: 4 lap port site incisions clean dry and intact with skin glue. Pressure dressing removed, pfannenstiel incision clean/dry/intact with Honeycomb dressing.  Ext: no calf edema or tenderness, SCDs on and cycling  Disposition: Discharge disposition: 01-Home or Self Care       Discharge Instructions      Remove dressing in 72 hours   Complete by: As directed    Remove honeycomb dressing on 11/8. Remove steri strips as they start to peel up   Call MD for:   Complete by: As  directed    Heavy vaginal bleeding soaking > 1 pad/hour   Call MD for:  difficulty breathing, headache or visual disturbances   Complete by: As directed    Call MD for:  extreme fatigue   Complete by: As directed    Call MD for:  hives   Complete by: As directed    Call MD for:  persistant dizziness or light-headedness   Complete by: As directed    Call MD for:  persistant nausea and vomiting   Complete by: As directed    Call MD for:  redness, tenderness, or signs of infection (pain, swelling, redness, odor or green/yellow discharge around incision site)   Complete by: As directed    Call MD for:  severe uncontrolled pain   Complete by: As directed    Call MD for:  temperature >100.4   Complete by: As directed    Diet - low sodium heart healthy   Complete by: As directed    Driving Restrictions   Complete by: As directed    No driving while taking narcotics   Increase activity slowly   Complete by: As directed    Lifting restrictions   Complete by: As directed    No heaving lifting more than 20 lbs   Other Restrictions   Complete by: As directed    No soaking in tubs/pools   Sexual Activity Restrictions   Complete by: As directed    Nothing in vagina x 6 weeks  Allergies as of 05/24/2022       Reactions   Prednisone Swelling        Medication List     TAKE these medications    acetaminophen 500 MG tablet Commonly known as: TYLENOL Take 1,000 mg by mouth every 6 (six) hours as needed for moderate pain.   ibuprofen 600 MG tablet Commonly known as: ADVIL Take 1 tablet (600 mg total) by mouth every 6 (six) hours as needed.   norgestimate-ethinyl estradiol 0.25-35 MG-MCG tablet Commonly known as: ORTHO-CYCLEN Take 1 tablet by mouth daily.   ondansetron 4 MG tablet Commonly known as: ZOFRAN Take 1 tablet (4 mg total) by mouth every 6 (six) hours as needed for nausea.   oxyCODONE 5 MG immediate release tablet Commonly known as: Oxy IR/ROXICODONE Take  5 mg by mouth every 4 (four) hours as needed for moderate pain or severe pain.   Slow Fe 142 (45 Fe) MG Tbcr Generic drug: Ferrous Sulfate Take 142 mg by mouth daily.        Follow-up Information     Rowland Lathe, MD. Go on 06/05/2022.   Specialty: Obstetrics and Gynecology Contact information: [address] [place] [address] [phone]                 Signed: Rowland Lathe 05/24/2022, 7:54 AM

## 2022-05-24 NOTE — Progress Notes (Signed)
Pt being discharged to home with mother. Discharge instructions and medication education provided to pt.

## 2022-05-25 ENCOUNTER — Encounter (HOSPITAL_BASED_OUTPATIENT_CLINIC_OR_DEPARTMENT_OTHER): Payer: Self-pay | Admitting: Obstetrics and Gynecology

## 2022-05-25 LAB — SURGICAL PATHOLOGY

## 2022-09-10 IMAGING — US US TRANSVAGINAL NON-OB
1 series · 15 of 25 positions shown · non-contrast
Comparison: CT abdomen/pelvis dated 09/05/2021. MR pelvis dated
05/03/2021.

CLINICAL DATA: Pelvic pain

EXAM:
TRANSABDOMINAL AND TRANSVAGINAL ULTRASOUND OF PELVIS
DOPPLER ULTRASOUND OF OVARIES
TECHNIQUE: Both transabdominal and transvaginal ultrasound examinations of the
pelvis were performed. Transabdominal technique was performed for
global imaging of the pelvis including uterus, ovaries, adnexal
regions, and pelvic cul-de-sac.
It was necessary to proceed with endovaginal exam following the
transabdominal exam to visualize the endometrium. Color and duplex
Doppler ultrasound was utilized to evaluate blood flow to the
ovaries.

[Series 1: us pelvis complete mc & wl · 85 acquisitions, 15 frames shown]
[im 1/85]
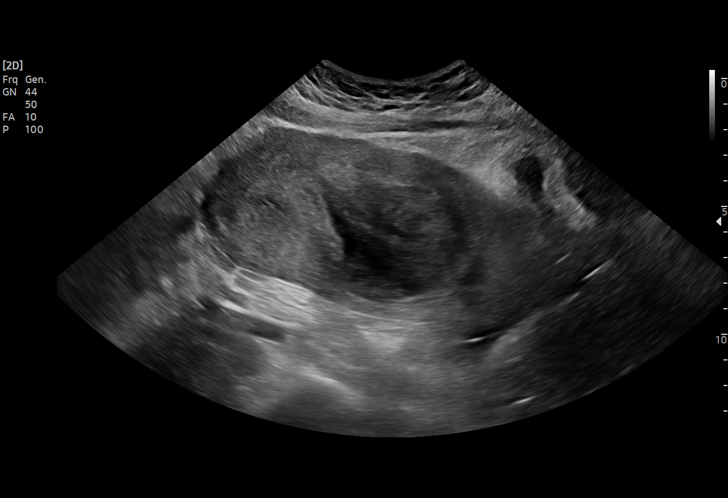
[im 8/85]
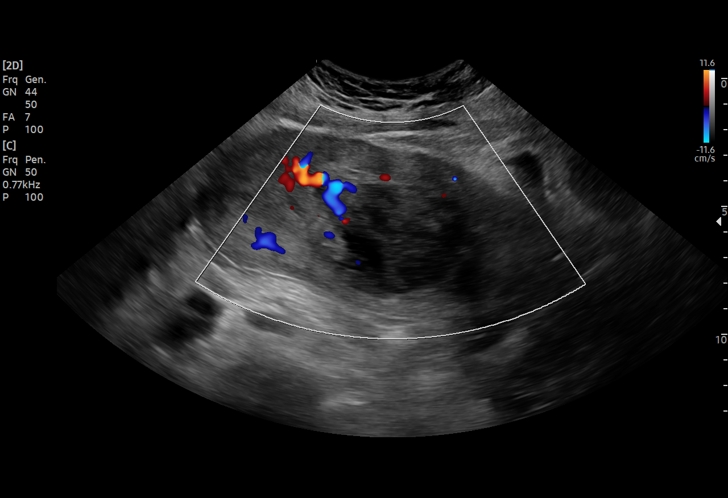
[im 15/85]
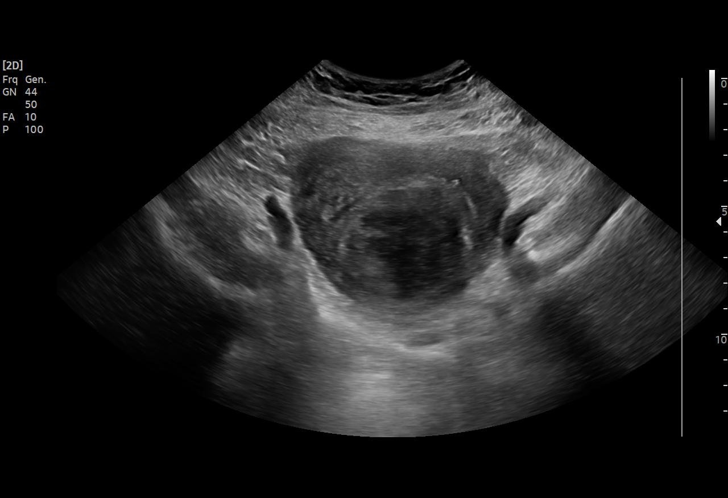
[im 18/85]
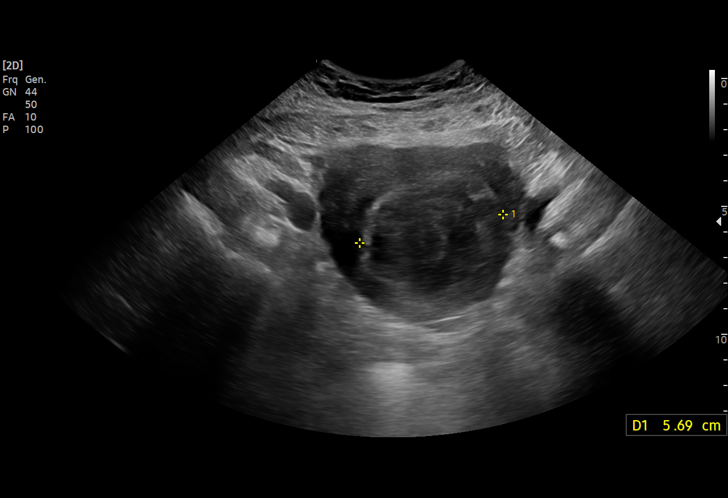
[im 25/85]
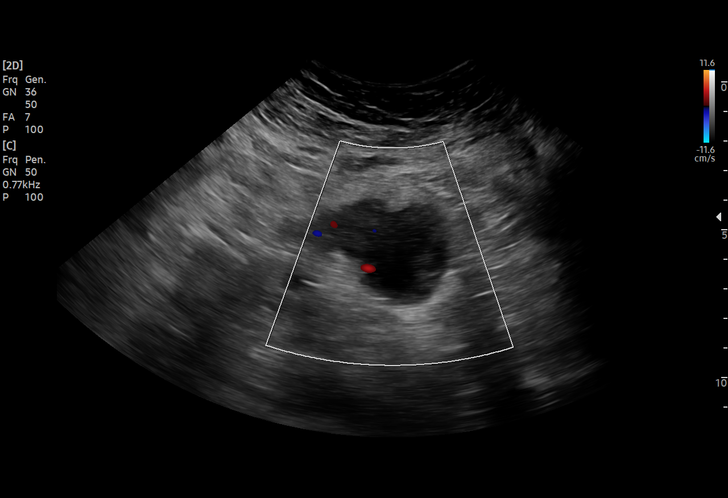
[im 32/85]
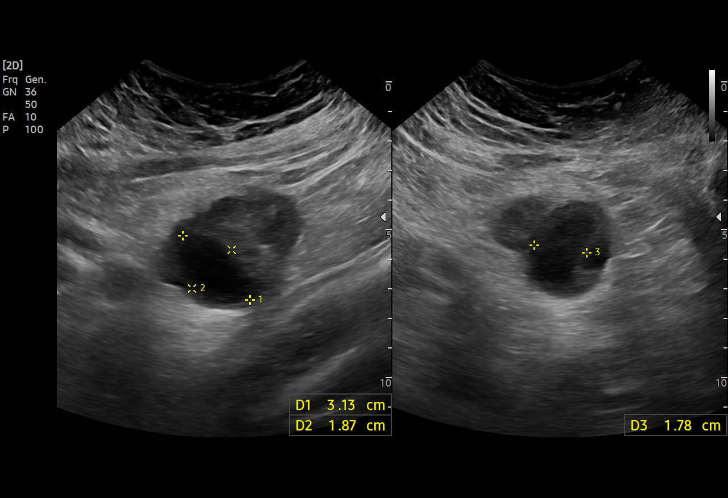
[im 36/85]
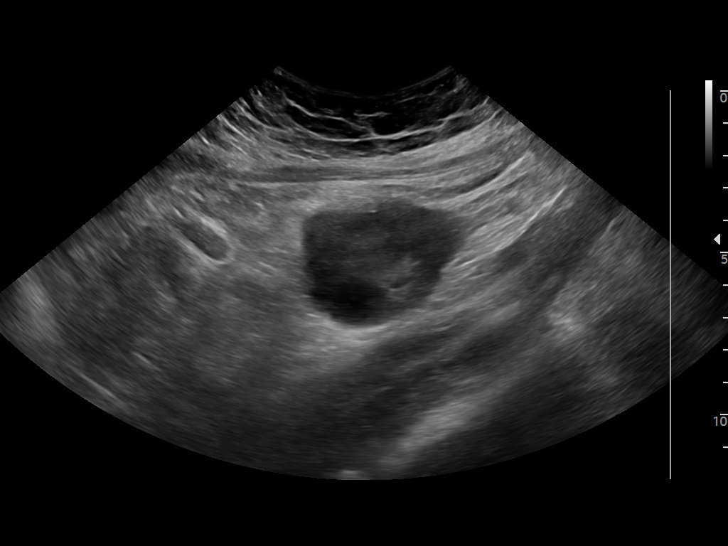
[im 43/85]
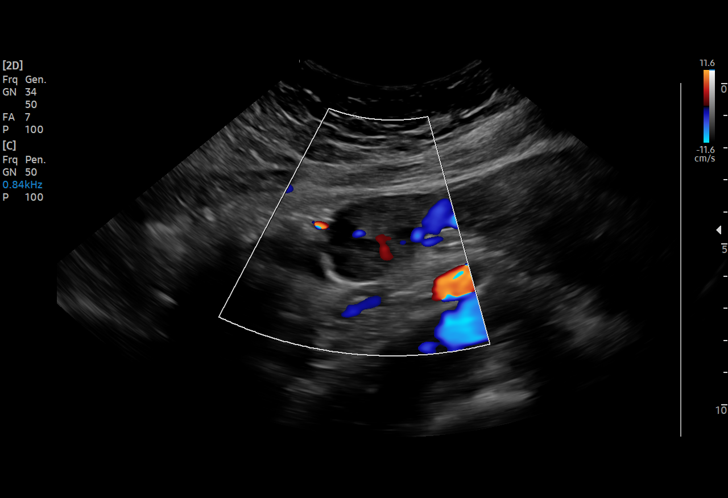
[im 50/85]
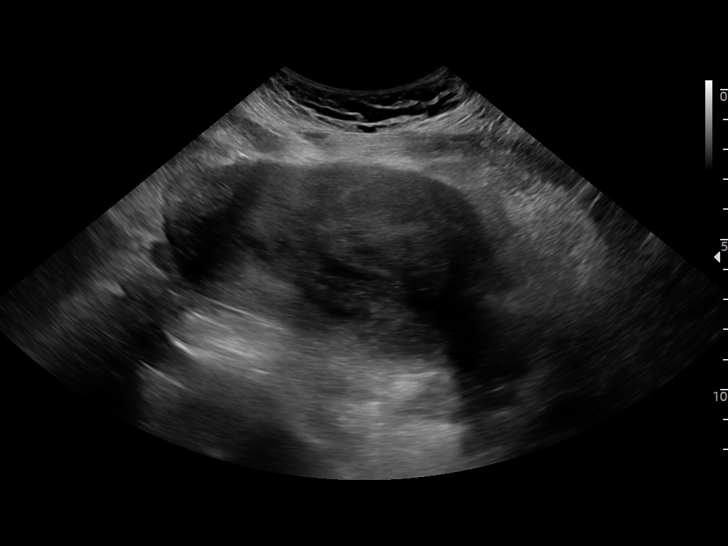
[im 53/85]
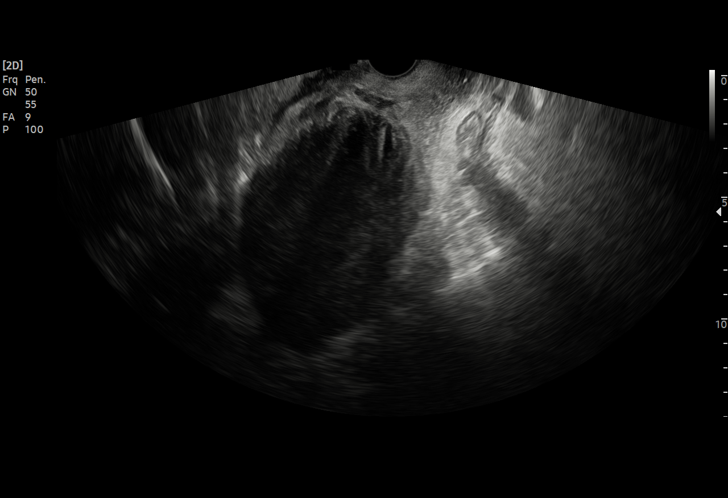
[im 60/85]
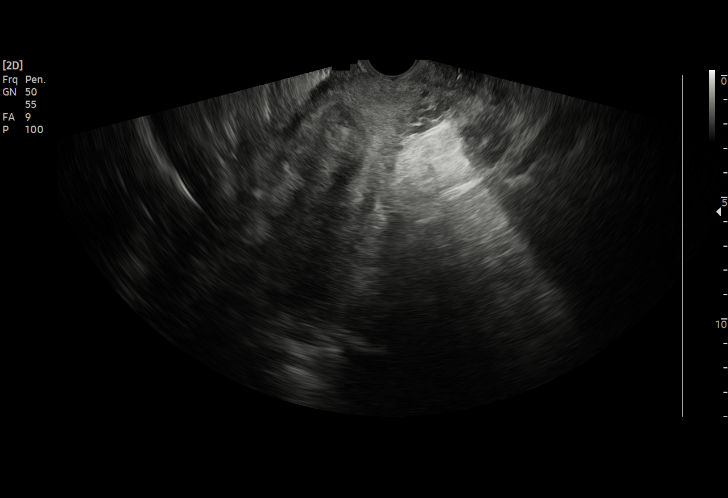
[im 67/85]
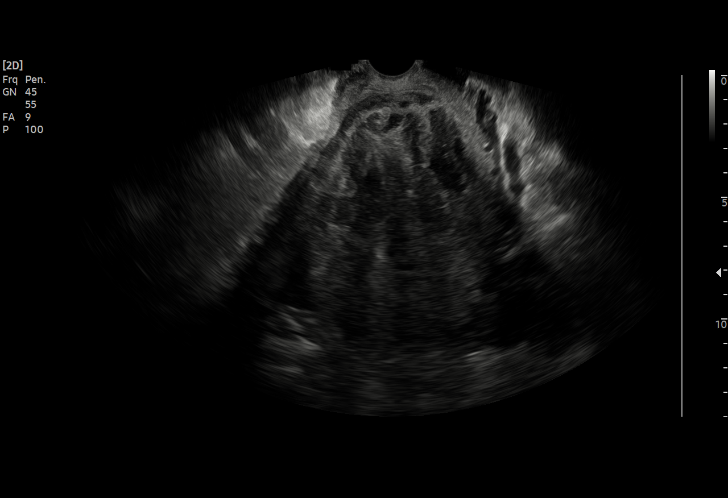
[im 71/85]
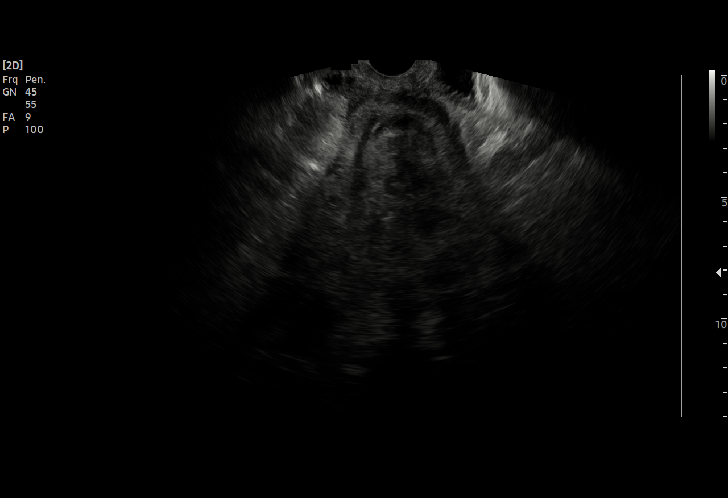
[im 78/85]
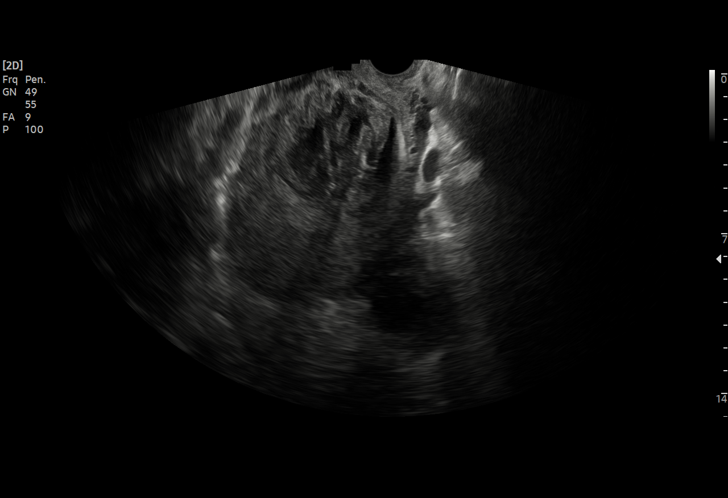
[im 85/85]
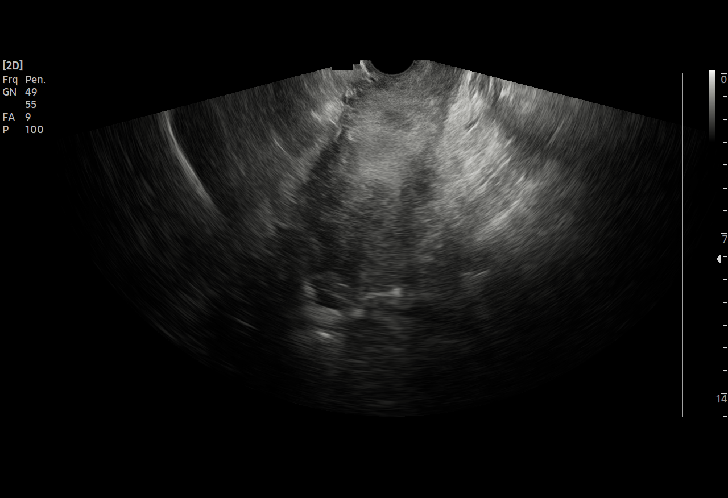

[15 of 25 positions shown; findings below may reference images not displayed]

FINDINGS: Uterus

Measurements: 15.0 x 6.4 x 7.8 cm = volume: 391 mL. 5.5 x 5.4 x
cm intramural/mucosal fibroid in the uterine body, previously 7.9 cm
on MR.

Endometrium

Thickness: 4 mm. Poorly visualized/distorted by the dominant
fibroid.

Right ovary

Measurements: 4.1 x 2.5 x 3.0 cm = volume: 16.0 mL. Normal
appearance/no adnexal mass.

Left ovary

Measurements: 5.3 x 3.9 x 4.2 cm = volume: 45.6 mL. 3.1 x 1.9 x
cm involuting corpus luteal cyst, physiologic/benign. No follow-up
recommended.

Pulsed Doppler evaluation of both ovaries demonstrates normal
low-resistance arterial and venous waveforms.

Other findings

No abnormal free fluid.
IMPRESSION: 5.5 cm intramural/mucosal fibroid in the uterine body, improved from
prior MR.

Otherwise negative pelvic ultrasound.

No evidence of ovarian torsion.

## 2022-09-10 IMAGING — US US ART/VEN ABD/PELV/SCROTUM DOPPLER LTD
1 series · 15 of 25 positions shown · non-contrast
Comparison: CT abdomen/pelvis dated 09/05/2021. MR pelvis dated
05/03/2021.

CLINICAL DATA: Pelvic pain

EXAM:
TRANSABDOMINAL AND TRANSVAGINAL ULTRASOUND OF PELVIS
DOPPLER ULTRASOUND OF OVARIES
TECHNIQUE: Both transabdominal and transvaginal ultrasound examinations of the
pelvis were performed. Transabdominal technique was performed for
global imaging of the pelvis including uterus, ovaries, adnexal
regions, and pelvic cul-de-sac.
It was necessary to proceed with endovaginal exam following the
transabdominal exam to visualize the endometrium. Color and duplex
Doppler ultrasound was utilized to evaluate blood flow to the
ovaries.

[Series 1: us pelvis complete mc & wl · 85 acquisitions, 15 frames shown]
[im 1/85]
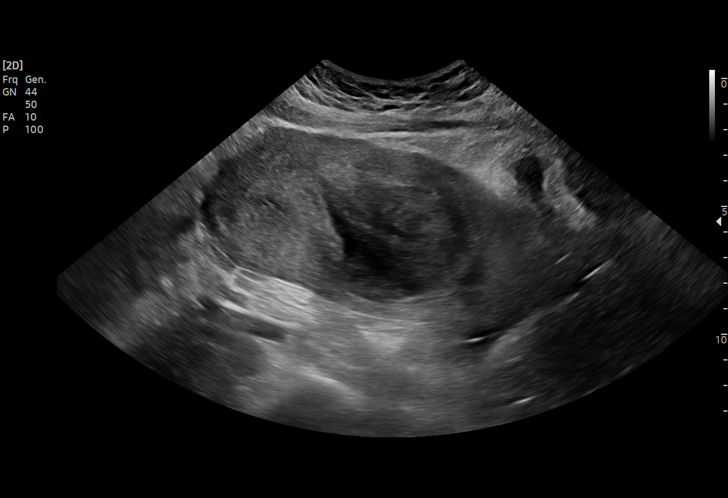
[im 8/85]
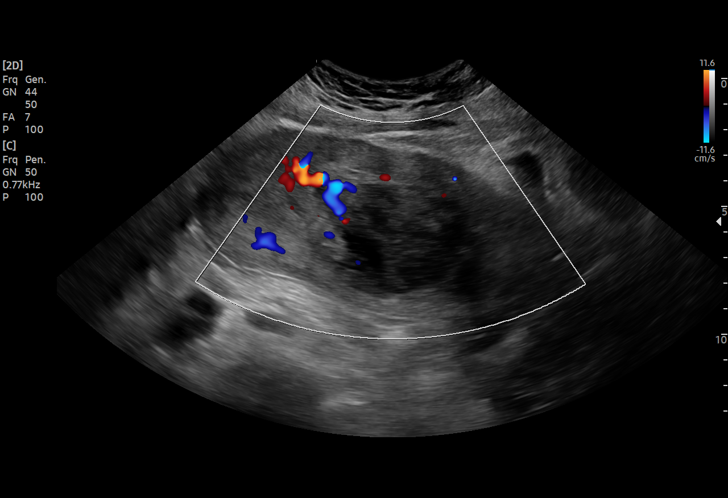
[im 15/85]
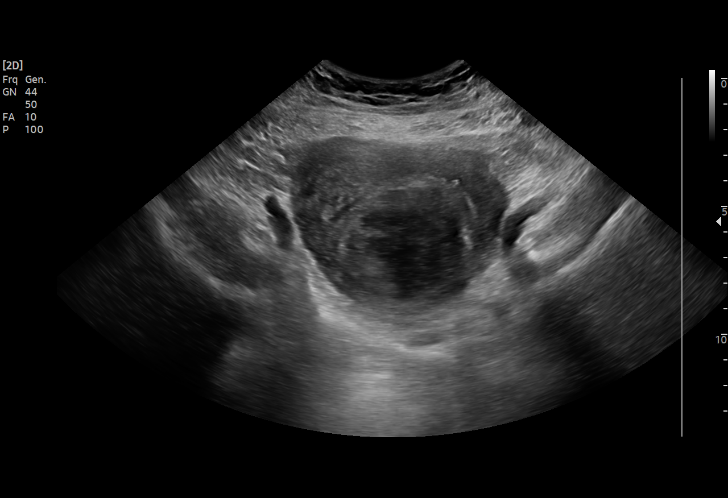
[im 18/85]
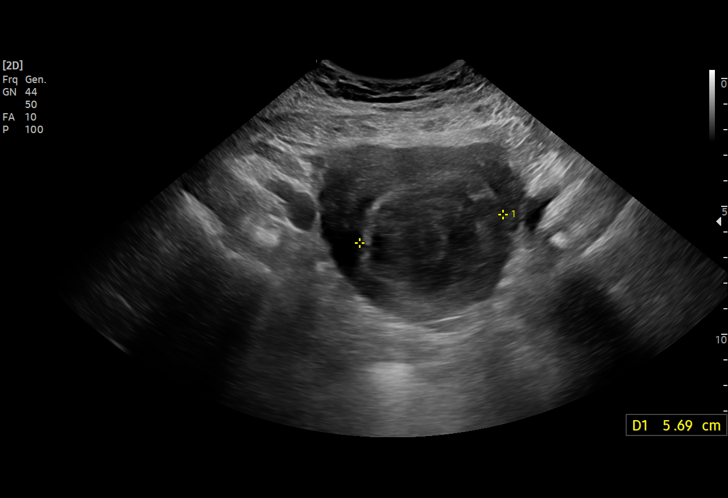
[im 25/85]
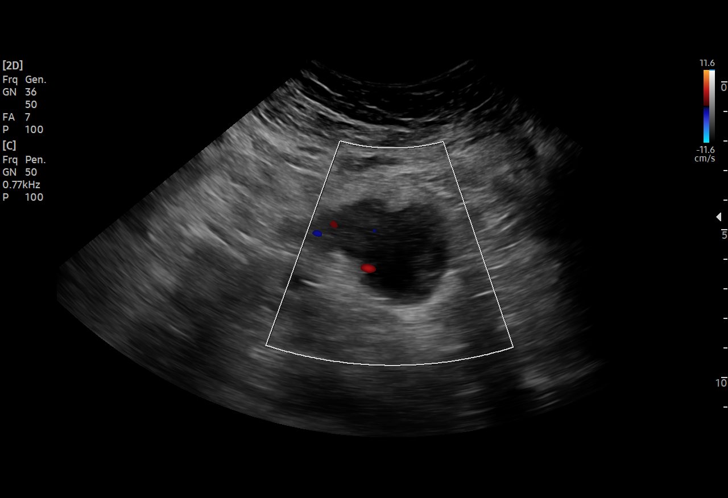
[im 32/85]
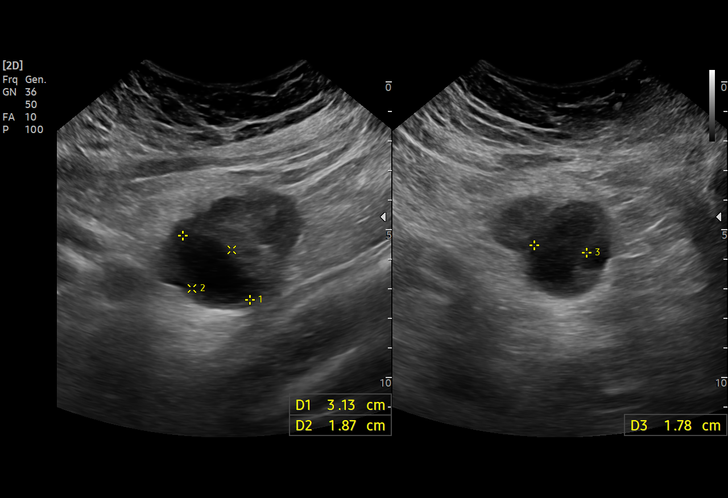
[im 36/85]
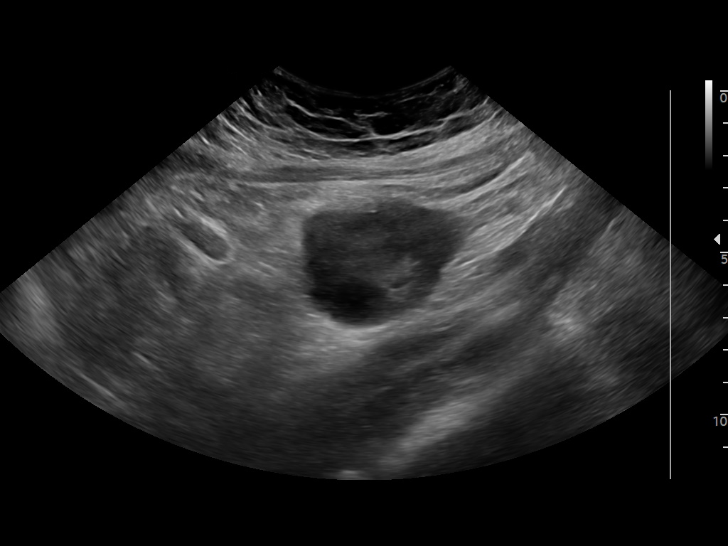
[im 43/85]
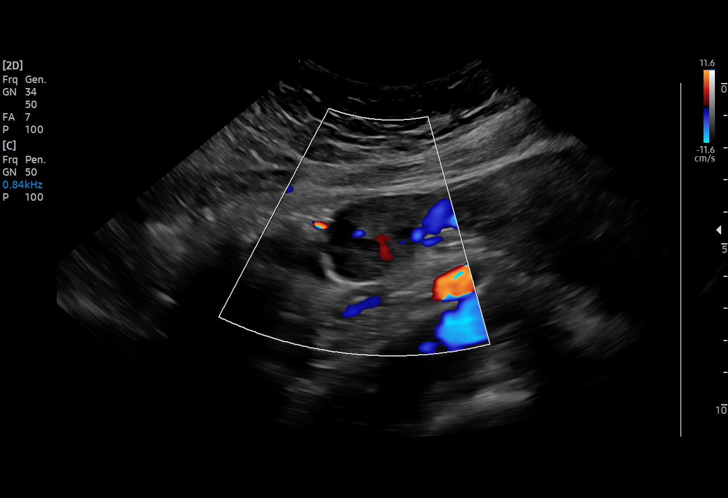
[im 50/85]
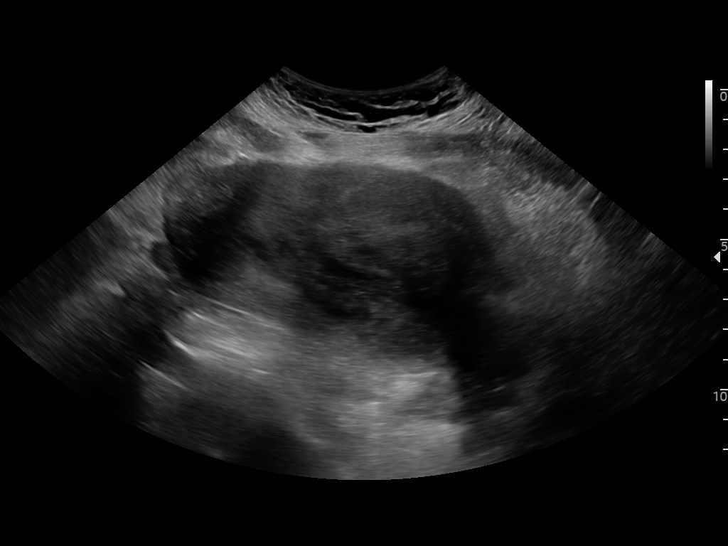
[im 53/85]
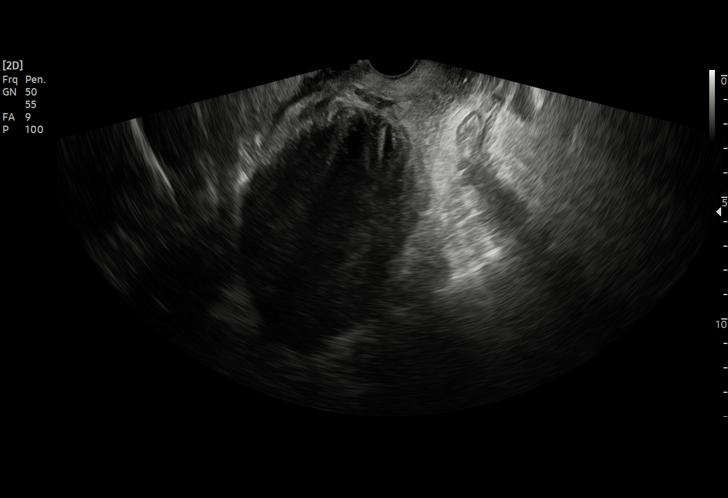
[im 60/85]
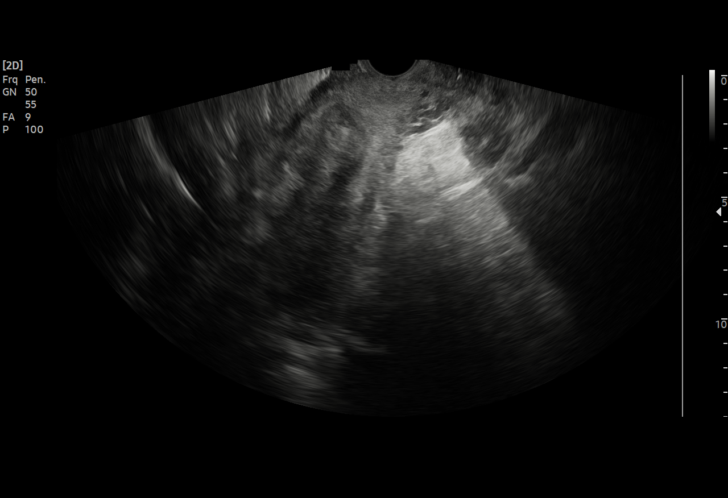
[im 67/85]
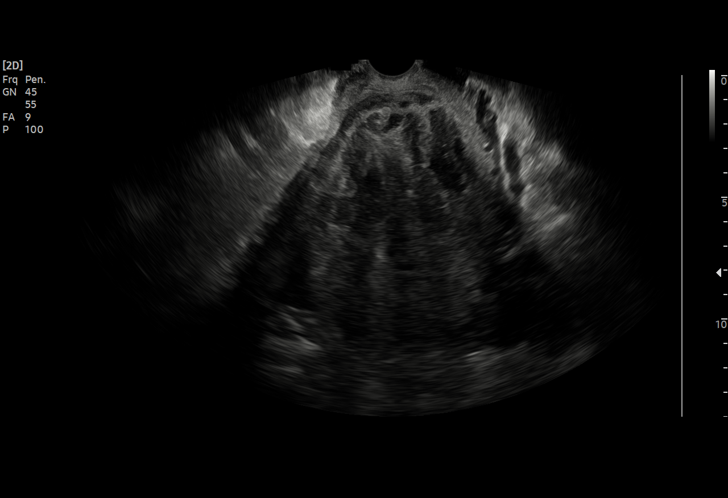
[im 71/85]
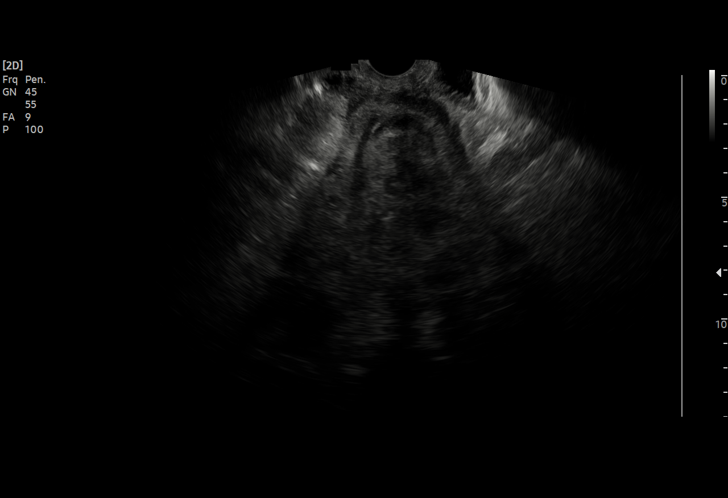
[im 78/85]
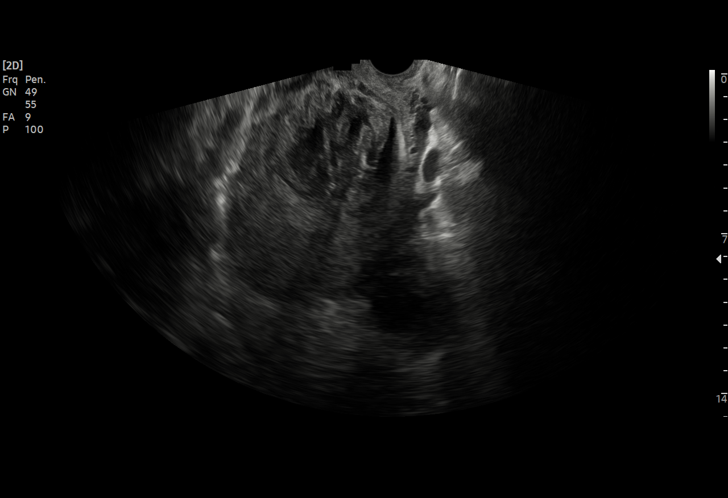
[im 85/85]
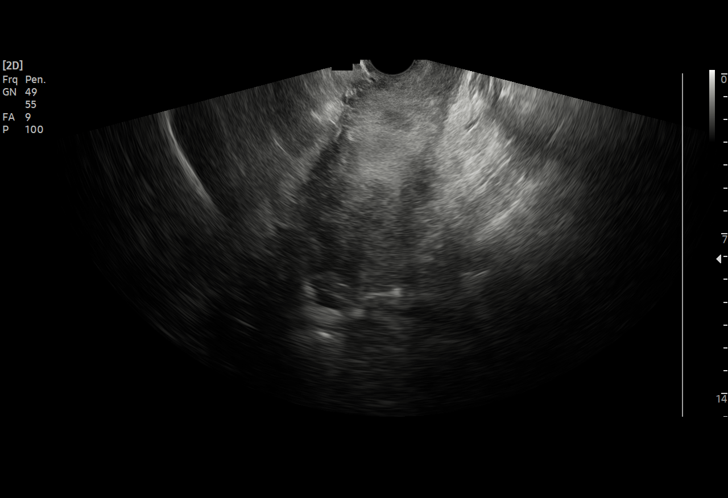

[15 of 25 positions shown; findings below may reference images not displayed]

FINDINGS: Uterus

Measurements: 15.0 x 6.4 x 7.8 cm = volume: 391 mL. 5.5 x 5.4 x
cm intramural/mucosal fibroid in the uterine body, previously 7.9 cm
on MR.

Endometrium

Thickness: 4 mm. Poorly visualized/distorted by the dominant
fibroid.

Right ovary

Measurements: 4.1 x 2.5 x 3.0 cm = volume: 16.0 mL. Normal
appearance/no adnexal mass.

Left ovary

Measurements: 5.3 x 3.9 x 4.2 cm = volume: 45.6 mL. 3.1 x 1.9 x
cm involuting corpus luteal cyst, physiologic/benign. No follow-up
recommended.

Pulsed Doppler evaluation of both ovaries demonstrates normal
low-resistance arterial and venous waveforms.

Other findings

No abnormal free fluid.
IMPRESSION: 5.5 cm intramural/mucosal fibroid in the uterine body, improved from
prior MR.

Otherwise negative pelvic ultrasound.

No evidence of ovarian torsion.

## 2022-09-10 IMAGING — CT CT ABD-PELV W/O CM
2 of 4 series · 15 of 46 positions shown, 17 images · non-contrast
Comparison: Pelvic MRI dated 05/03/2021.

CLINICAL DATA: Left lower quadrant tenderness.  Sepsis.



[Series 2: axial st · axial · 0.69mm/px · z∈[-673,-273]mm · 12 of 90 slices shown, 14 images]
[im 5/90  soft-tissue]
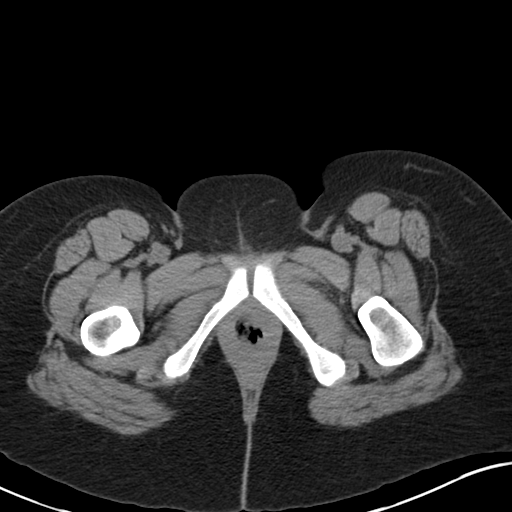
[im 5/90  bone]
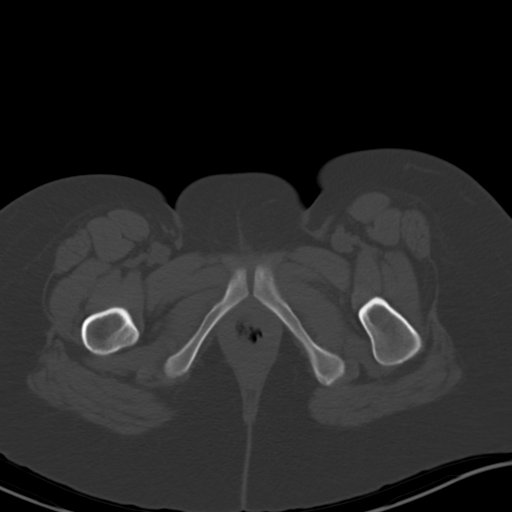
[im 13/90  soft-tissue]
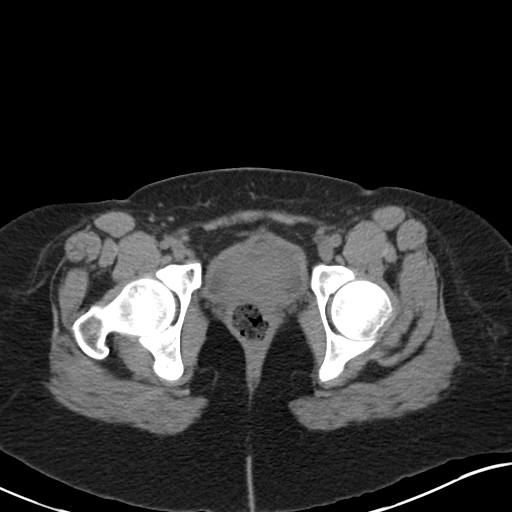
[im 22/90  soft-tissue]
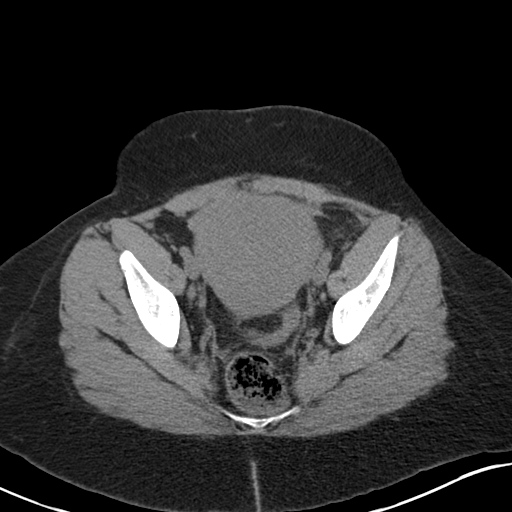
[im 26/90  soft-tissue]
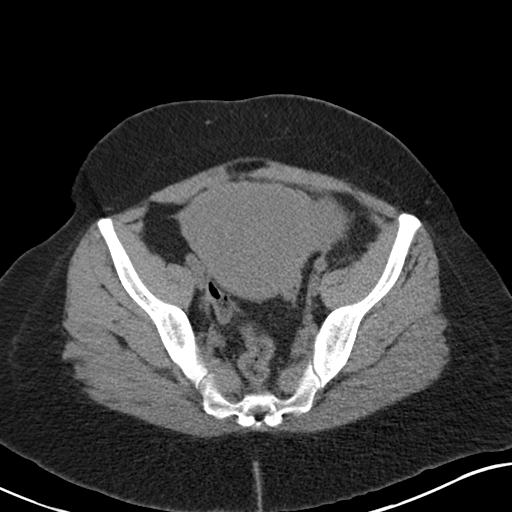
[im 34/90  soft-tissue]
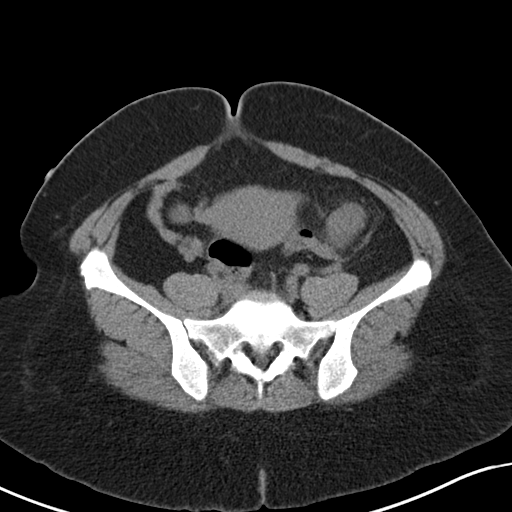
[im 43/90  soft-tissue]
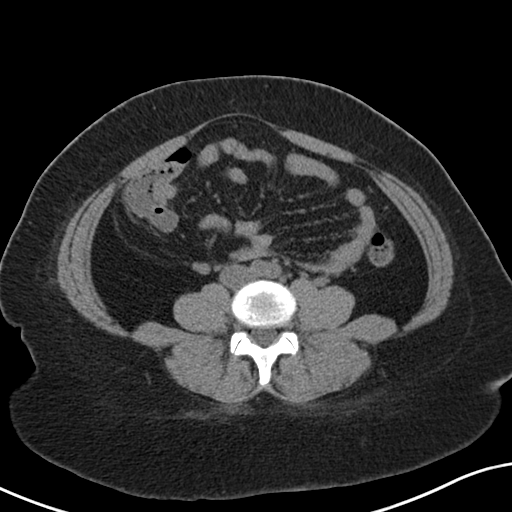
[im 47/90  soft-tissue]
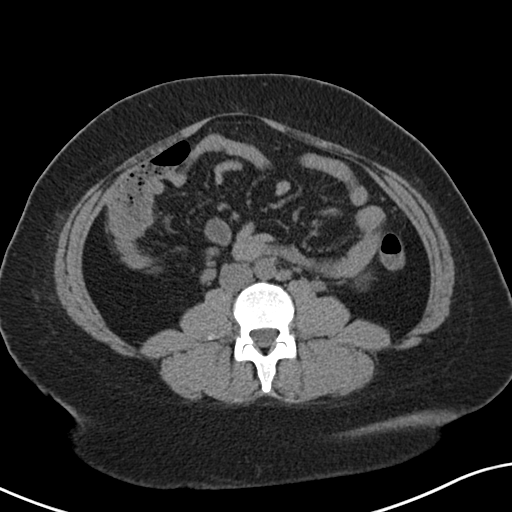
[im 56/90  soft-tissue]
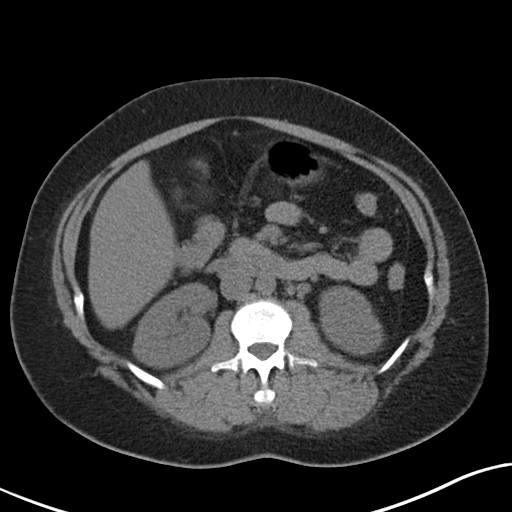
[im 64/90  soft-tissue]
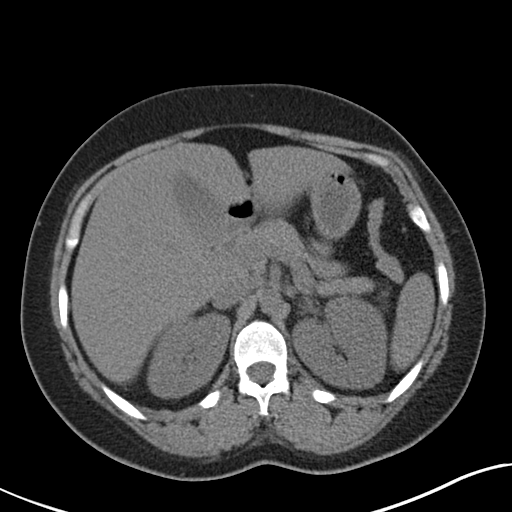
[im 64/90  bone]
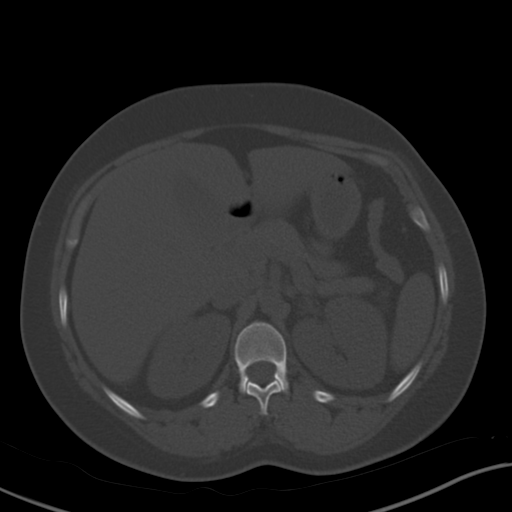
[im 68/90  soft-tissue]
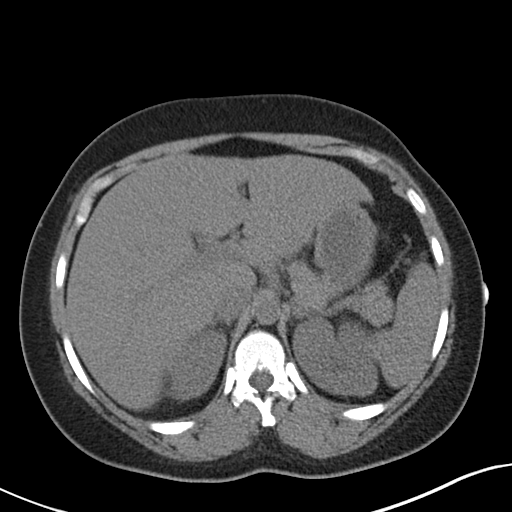
[im 77/90  soft-tissue]
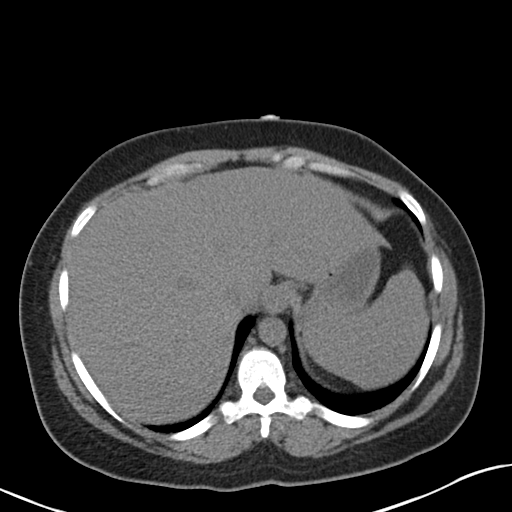
[im 85/90  soft-tissue]
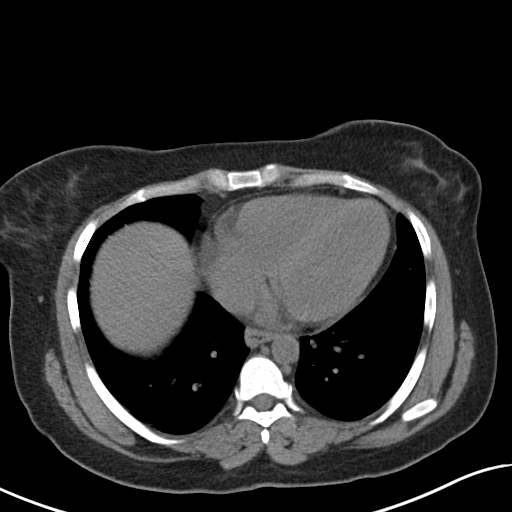

[Series 4: coronal st · coronal · 0.65mm/px · 3 of 137 slices shown]
[im 46/137  soft-tissue]
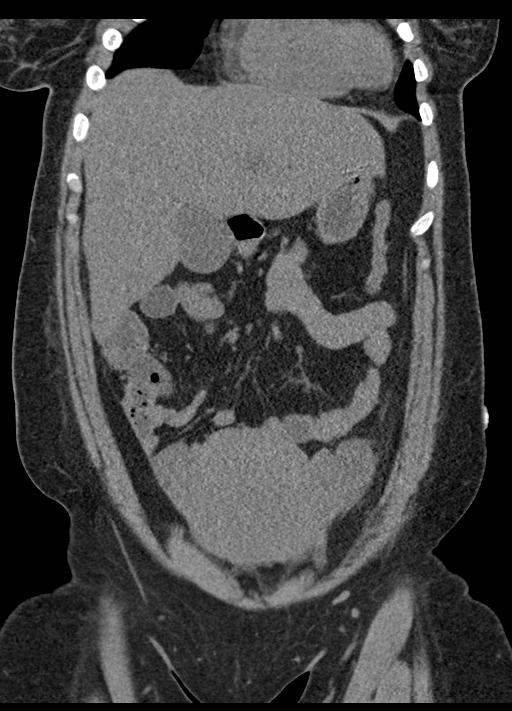
[im 61/137  soft-tissue]
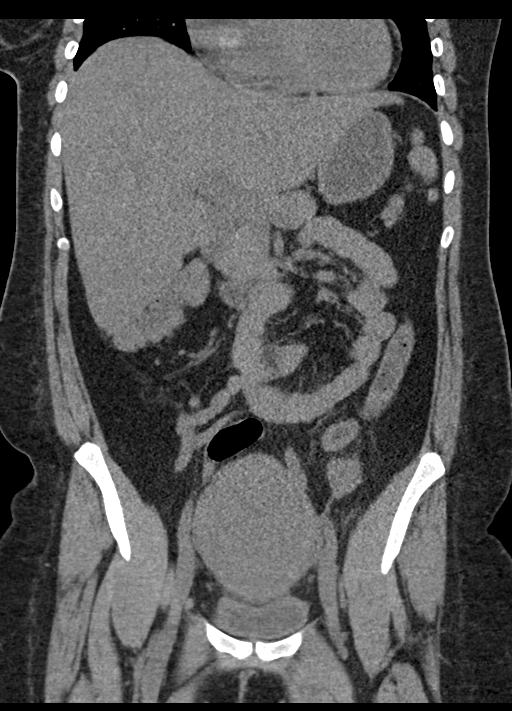
[im 76/137  soft-tissue]
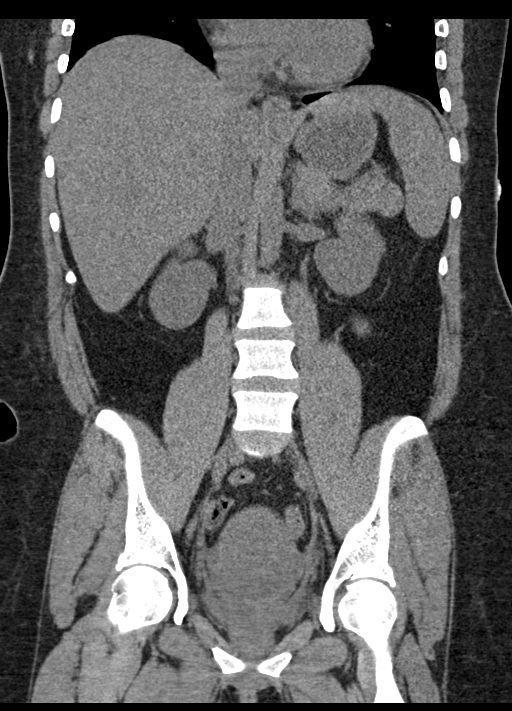

[15 of 46 positions shown; findings below may reference images not displayed]

FINDINGS: Evaluation of this exam is limited in the absence of intravenous
contrast.

Lower chest: The visualized lung bases are clear.

No intra-abdominal free air or free fluid.

Hepatobiliary: No focal liver abnormality is seen. No gallstones,
gallbladder wall thickening, or biliary dilatation.

Pancreas: Unremarkable. No pancreatic ductal dilatation or
surrounding inflammatory changes.

Spleen: Normal in size without focal abnormality.

Adrenals/Urinary Tract: The adrenal glands unremarkable. There is no
hydronephrosis or nephrolithiasis on either side. The visualized
ureters and urinary bladder appear unremarkable.

Stomach/Bowel: There is no bowel obstruction or active inflammation.
The appendix is normal.

Vascular/Lymphatic: The abdominal aorta and IVC unremarkable on this
noncontrast CT. No portal venous gas. There is no adenopathy.

Reproductive: The uterus is mildly enlarged. There is apparent
enlargement of the left adnexa with surrounding stranding. This is
suboptimally evaluated on this noncontrast CT. And ovarian torsion
is not excluded. Further evaluation with ultrasound with duplex
interrogation of flow to the ovaries is recommended.

Other: None

Musculoskeletal: No acute or significant osseous findings.
IMPRESSION: 1. No hydronephrosis or nephrolithiasis.
2. Enlarged appearance of the left ovary with surrounding stranding.
Further evaluation with ultrasound with duplex interrogation of
ovarian flow recommended to exclude torsion.
3. No bowel obstruction. Normal appendix.
